# Patient Record
Sex: Male | Born: 1940 | Race: White | Hispanic: No | Marital: Single | Smoking: Former smoker
Health system: Southern US, Community
[De-identification: ages and names within clinical notes are randomized; demographics above are authoritative.]

## PROBLEM LIST (undated history)

## (undated) DIAGNOSIS — Z8719 Personal history of other diseases of the digestive system: Secondary | ICD-10-CM

## (undated) DIAGNOSIS — I219 Acute myocardial infarction, unspecified: Secondary | ICD-10-CM

## (undated) DIAGNOSIS — K219 Gastro-esophageal reflux disease without esophagitis: Secondary | ICD-10-CM

## (undated) DIAGNOSIS — J069 Acute upper respiratory infection, unspecified: Secondary | ICD-10-CM

## (undated) DIAGNOSIS — Z5189 Encounter for other specified aftercare: Secondary | ICD-10-CM

## (undated) DIAGNOSIS — G4730 Sleep apnea, unspecified: Secondary | ICD-10-CM

## (undated) DIAGNOSIS — I2510 Atherosclerotic heart disease of native coronary artery without angina pectoris: Secondary | ICD-10-CM

## (undated) DIAGNOSIS — J449 Chronic obstructive pulmonary disease, unspecified: Secondary | ICD-10-CM

## (undated) DIAGNOSIS — I10 Essential (primary) hypertension: Secondary | ICD-10-CM

## (undated) DIAGNOSIS — M109 Gout, unspecified: Secondary | ICD-10-CM

## (undated) DIAGNOSIS — R0602 Shortness of breath: Secondary | ICD-10-CM

## (undated) HISTORY — PX: CORONARY ARTERY BYPASS GRAFT: SHX141

## (undated) HISTORY — PX: CERVICAL FUSION: SHX112

## (undated) HISTORY — PX: COLON SURGERY: SHX602

## (undated) HISTORY — PX: REVISION OF SCAR ON FACE/HEAD: SHX2349

## (undated) HISTORY — PX: KNEE SURGERY: SHX244

## (undated) HISTORY — PX: FINGER SURGERY: SHX640

## (undated) HISTORY — PX: CARDIAC CATHETERIZATION: SHX172

## (undated) HISTORY — PX: HERNIA REPAIR: SHX51

## (undated) HISTORY — PX: EYE SURGERY: SHX253

---

## 1998-03-21 ENCOUNTER — Emergency Department (HOSPITAL_COMMUNITY): Admission: EM | Admit: 1998-03-21 | Discharge: 1998-03-21 | Payer: Self-pay | Admitting: Emergency Medicine

## 1998-04-04 ENCOUNTER — Observation Stay (HOSPITAL_COMMUNITY): Admission: RE | Admit: 1998-04-04 | Discharge: 1998-04-05 | Payer: Self-pay | Admitting: Urology

## 1998-05-11 ENCOUNTER — Inpatient Hospital Stay (HOSPITAL_COMMUNITY): Admission: EM | Admit: 1998-05-11 | Discharge: 1998-05-13 | Payer: Self-pay | Admitting: Emergency Medicine

## 1998-05-12 ENCOUNTER — Encounter: Payer: Self-pay | Admitting: Urology

## 1998-07-12 ENCOUNTER — Emergency Department (HOSPITAL_COMMUNITY): Admission: EM | Admit: 1998-07-12 | Discharge: 1998-07-12 | Payer: Self-pay | Admitting: Emergency Medicine

## 1998-07-12 ENCOUNTER — Encounter: Payer: Self-pay | Admitting: Emergency Medicine

## 1998-07-13 ENCOUNTER — Emergency Department (HOSPITAL_COMMUNITY): Admission: EM | Admit: 1998-07-13 | Discharge: 1998-07-14 | Payer: Self-pay | Admitting: Emergency Medicine

## 1998-07-13 ENCOUNTER — Encounter: Payer: Self-pay | Admitting: Urology

## 1998-08-29 ENCOUNTER — Ambulatory Visit (HOSPITAL_BASED_OUTPATIENT_CLINIC_OR_DEPARTMENT_OTHER): Admission: RE | Admit: 1998-08-29 | Discharge: 1998-08-29 | Payer: Self-pay | Admitting: General Surgery

## 1999-06-29 ENCOUNTER — Emergency Department (HOSPITAL_COMMUNITY): Admission: EM | Admit: 1999-06-29 | Discharge: 1999-06-29 | Payer: Self-pay | Admitting: Emergency Medicine

## 1999-08-16 ENCOUNTER — Encounter: Payer: Self-pay | Admitting: Emergency Medicine

## 1999-08-16 ENCOUNTER — Emergency Department (HOSPITAL_COMMUNITY): Admission: EM | Admit: 1999-08-16 | Discharge: 1999-08-16 | Payer: Self-pay | Admitting: Emergency Medicine

## 1999-08-18 ENCOUNTER — Inpatient Hospital Stay (HOSPITAL_COMMUNITY): Admission: EM | Admit: 1999-08-18 | Discharge: 1999-08-21 | Payer: Self-pay | Admitting: Internal Medicine

## 1999-09-04 ENCOUNTER — Ambulatory Visit (HOSPITAL_COMMUNITY): Admission: RE | Admit: 1999-09-04 | Discharge: 1999-09-04 | Payer: Self-pay | Admitting: Urology

## 1999-09-04 ENCOUNTER — Encounter: Payer: Self-pay | Admitting: Urology

## 1999-12-03 ENCOUNTER — Emergency Department (HOSPITAL_COMMUNITY): Admission: EM | Admit: 1999-12-03 | Discharge: 1999-12-03 | Payer: Self-pay | Admitting: *Deleted

## 1999-12-03 ENCOUNTER — Encounter: Payer: Self-pay | Admitting: Emergency Medicine

## 2000-05-11 ENCOUNTER — Ambulatory Visit (HOSPITAL_COMMUNITY): Admission: RE | Admit: 2000-05-11 | Discharge: 2000-05-11 | Payer: Self-pay | Admitting: Neurological Surgery

## 2000-05-11 ENCOUNTER — Encounter: Payer: Self-pay | Admitting: Neurological Surgery

## 2000-09-24 ENCOUNTER — Encounter: Payer: Self-pay | Admitting: Neurological Surgery

## 2000-09-26 ENCOUNTER — Inpatient Hospital Stay (HOSPITAL_COMMUNITY): Admission: RE | Admit: 2000-09-26 | Discharge: 2000-09-28 | Payer: Self-pay | Admitting: Neurological Surgery

## 2000-09-26 ENCOUNTER — Encounter: Payer: Self-pay | Admitting: Neurological Surgery

## 2000-10-14 ENCOUNTER — Encounter: Admission: RE | Admit: 2000-10-14 | Discharge: 2000-10-14 | Payer: Self-pay | Admitting: Neurological Surgery

## 2000-10-14 ENCOUNTER — Encounter: Payer: Self-pay | Admitting: Neurological Surgery

## 2001-01-29 ENCOUNTER — Emergency Department (HOSPITAL_COMMUNITY): Admission: EM | Admit: 2001-01-29 | Discharge: 2001-01-30 | Payer: Self-pay | Admitting: Emergency Medicine

## 2001-02-28 ENCOUNTER — Encounter: Payer: Self-pay | Admitting: Neurological Surgery

## 2001-02-28 ENCOUNTER — Encounter: Admission: RE | Admit: 2001-02-28 | Discharge: 2001-02-28 | Payer: Self-pay | Admitting: Neurological Surgery

## 2001-04-07 ENCOUNTER — Encounter: Payer: Self-pay | Admitting: Gastroenterology

## 2001-04-07 ENCOUNTER — Encounter: Admission: RE | Admit: 2001-04-07 | Discharge: 2001-04-07 | Payer: Self-pay | Admitting: Gastroenterology

## 2001-05-02 ENCOUNTER — Encounter: Payer: Self-pay | Admitting: Gastroenterology

## 2001-05-02 ENCOUNTER — Ambulatory Visit (HOSPITAL_COMMUNITY): Admission: RE | Admit: 2001-05-02 | Discharge: 2001-05-02 | Payer: Self-pay | Admitting: Gastroenterology

## 2001-07-11 ENCOUNTER — Ambulatory Visit (HOSPITAL_COMMUNITY): Admission: RE | Admit: 2001-07-11 | Discharge: 2001-07-11 | Payer: Self-pay | Admitting: Gastroenterology

## 2001-07-22 ENCOUNTER — Emergency Department (HOSPITAL_COMMUNITY): Admission: EM | Admit: 2001-07-22 | Discharge: 2001-07-22 | Payer: Self-pay

## 2001-07-25 ENCOUNTER — Encounter: Payer: Self-pay | Admitting: Urology

## 2001-07-25 ENCOUNTER — Ambulatory Visit (HOSPITAL_COMMUNITY): Admission: RE | Admit: 2001-07-25 | Discharge: 2001-07-25 | Payer: Self-pay | Admitting: Urology

## 2001-12-09 ENCOUNTER — Encounter: Admission: RE | Admit: 2001-12-09 | Discharge: 2001-12-09 | Payer: Self-pay | Admitting: Neurosurgery

## 2001-12-09 ENCOUNTER — Encounter: Payer: Self-pay | Admitting: Neurosurgery

## 2001-12-17 ENCOUNTER — Encounter: Payer: Self-pay | Admitting: Neurosurgery

## 2001-12-22 ENCOUNTER — Ambulatory Visit (HOSPITAL_COMMUNITY): Admission: RE | Admit: 2001-12-22 | Discharge: 2001-12-22 | Payer: Self-pay | Admitting: Neurosurgery

## 2001-12-22 ENCOUNTER — Encounter: Payer: Self-pay | Admitting: Neurosurgery

## 2002-02-19 ENCOUNTER — Encounter: Admission: RE | Admit: 2002-02-19 | Discharge: 2002-02-19 | Payer: Self-pay | Admitting: Neurosurgery

## 2002-02-19 ENCOUNTER — Encounter: Payer: Self-pay | Admitting: Neurosurgery

## 2002-04-27 ENCOUNTER — Ambulatory Visit (HOSPITAL_COMMUNITY): Admission: RE | Admit: 2002-04-27 | Discharge: 2002-04-27 | Payer: Self-pay | Admitting: Internal Medicine

## 2003-06-22 ENCOUNTER — Encounter: Admission: RE | Admit: 2003-06-22 | Discharge: 2003-06-22 | Payer: Self-pay | Admitting: Neurosurgery

## 2003-06-28 ENCOUNTER — Encounter: Admission: RE | Admit: 2003-06-28 | Discharge: 2003-09-26 | Payer: Self-pay | Admitting: Neurosurgery

## 2004-12-06 ENCOUNTER — Ambulatory Visit: Payer: Self-pay | Admitting: Oncology

## 2011-08-20 ENCOUNTER — Encounter (INDEPENDENT_AMBULATORY_CARE_PROVIDER_SITE_OTHER): Payer: Medicare Other | Admitting: Ophthalmology

## 2011-08-20 DIAGNOSIS — H35039 Hypertensive retinopathy, unspecified eye: Secondary | ICD-10-CM

## 2011-08-20 DIAGNOSIS — I10 Essential (primary) hypertension: Secondary | ICD-10-CM

## 2011-08-20 DIAGNOSIS — H33009 Unspecified retinal detachment with retinal break, unspecified eye: Secondary | ICD-10-CM

## 2011-08-20 DIAGNOSIS — H43819 Vitreous degeneration, unspecified eye: Secondary | ICD-10-CM

## 2011-08-20 DIAGNOSIS — H33002 Unspecified retinal detachment with retinal break, left eye: Secondary | ICD-10-CM

## 2011-08-20 NOTE — H&P (Signed)
Bryce Buck is an 71 y.o. male.   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    Negative for HIV and Hepatitis  Coronary Artery bypass graft 2007.  Finger amputations both hands  No family history on file. Social History:  does not have a smoking history on file. He does not have any smokeless tobacco history on file. His alcohol and drug histories not on file.  Allergies: Allergies not on file  No current facility-administered medications on file as of .   No current outpatient prescriptions on file as of .    Review of systems otherwise negative  There were no vitals taken for this visit.  Physical exam: Mental status: oriented x3. Eyes: See eye exam associated with this date and this surgery.  Scanned in by scanning center Ears, Nose, Throat: within normal limits Neck: Within Normal limits General: within normal limits Chest: Within normal limits Breast: deferred Heart: Within normal limits Abdomen: Within normal limits GU: deferred Extremities: within normal limits Skin: within normal limits  Assessment/Plan Rhegmatogenous retinal detachment left eye Plan: To [facility] for scleral buckle and possible vitrectomy left eye  Sherrie George 08/20/2011, 5:55 PM

## 2011-08-23 ENCOUNTER — Other Ambulatory Visit: Payer: Self-pay

## 2011-08-23 ENCOUNTER — Encounter (HOSPITAL_COMMUNITY): Payer: Self-pay | Admitting: Pharmacy Technician

## 2011-08-23 ENCOUNTER — Encounter (HOSPITAL_COMMUNITY)
Admission: RE | Admit: 2011-08-23 | Discharge: 2011-08-23 | Disposition: A | Discharge status: Home / Self Care | Payer: Medicare Other | Source: Ambulatory Visit | Attending: Ophthalmology | Admitting: Ophthalmology

## 2011-08-23 ENCOUNTER — Ambulatory Visit (HOSPITAL_COMMUNITY)
Admission: RE | Admit: 2011-08-23 | Discharge: 2011-08-23 | Disposition: A | Discharge status: Home / Self Care | Payer: Medicare Other | Source: Ambulatory Visit | Attending: Anesthesiology | Admitting: Anesthesiology

## 2011-08-23 ENCOUNTER — Encounter (HOSPITAL_COMMUNITY): Payer: Self-pay

## 2011-08-23 DIAGNOSIS — Z01818 Encounter for other preprocedural examination: Secondary | ICD-10-CM | POA: Insufficient documentation

## 2011-08-23 DIAGNOSIS — Z01810 Encounter for preprocedural cardiovascular examination: Secondary | ICD-10-CM | POA: Insufficient documentation

## 2011-08-23 DIAGNOSIS — Z01812 Encounter for preprocedural laboratory examination: Secondary | ICD-10-CM | POA: Insufficient documentation

## 2011-08-23 HISTORY — DX: Acute upper respiratory infection, unspecified: J06.9

## 2011-08-23 HISTORY — DX: Acute myocardial infarction, unspecified: I21.9

## 2011-08-23 HISTORY — DX: Encounter for other specified aftercare: Z51.89

## 2011-08-23 HISTORY — DX: Essential (primary) hypertension: I10

## 2011-08-23 HISTORY — DX: Sleep apnea, unspecified: G47.30

## 2011-08-23 HISTORY — DX: Atherosclerotic heart disease of native coronary artery without angina pectoris: I25.10

## 2011-08-23 HISTORY — DX: Shortness of breath: R06.02

## 2011-08-23 LAB — BASIC METABOLIC PANEL
CO2: 26 mEq/L (ref 19–32)
Glucose, Bld: 72 mg/dL (ref 70–99)
Potassium: 4 mEq/L (ref 3.5–5.1)
Sodium: 139 mEq/L (ref 135–145)

## 2011-08-23 LAB — CBC
Hemoglobin: 15.5 g/dL (ref 13.0–17.0)
MCH: 30.3 pg (ref 26.0–34.0)
MCV: 88.1 fL (ref 78.0–100.0)
RBC: 5.11 MIL/uL (ref 4.22–5.81)
WBC: 8.5 10*3/uL (ref 4.0–10.5)

## 2011-08-23 NOTE — Progress Notes (Signed)
Faxed request 2x's to [facility] for records
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
## 2011-08-23 NOTE — Consult Note (Signed)
Anesthesia:  Patient is a 71 year old male who presented to Dr. Anastasio Auerbach office on 08/20/11 with loss of vision in his left eye and was found to have a retinal detachment.  He is scheduled for a scleral buckle with possible vitrectomy on 08/28/11.    He has a history of a MI and CABG in 2007 [facility]).  His Cardiologist is Dr Bing Matter [phone]) with [facility] in [place].  He hasn't been seen since shortly after his CABG.  (His story is a little vague, but it sounds like he got upset with his Cardiologist because Dr. Bing Matter would not clear him for a shoulder surgery without being seen, and the surgery was canceled.)  He has not had further chest pain since his MI in 2007 and denies SOB at rest.  He does get mild DOE with exertion, but this is more related to activities outside (not indoors).  He lives alone and spends most of his time watching TV.  He does do his own shopping and can walk around the stores without difficulty.  He quit smoking 21 years ago and denies history of DM or CVA.  He denies edema, palpitations, presyncope.  He is actually going to get re-established with Dr. Bing Matter on 09/05/11, but he could not get him in before his planned surgery date.  His PCP is Dr Cheri Rous who maintains him on ASA, Norvasc, Toprol XL, Zocor, and Prilosec.    His EKG today shows SB at 56 bpm, cannot rule out anterior infarct (age undetermined).  Currently there are no comparison EKGs.  We did request records from [facility].    Additional history includes OSA with use of CPAP, colon resection with colostomy (4-5 years ago) following colonoscopy complicated by bowel perforation, HTN, partial finger amputations, cervical fusion, and prior knee and hernia surgeries.  CXR shows: Status post median sternotomy. No active disease. Mild  hyperinflation. Minimal basilar atelectasis or scarring.   Labs acceptable.  I reviewed above with Anesthesiologist Dr. Katrinka Blazing.  Okay to proceed  with this procedure if no new CV symptoms.

## 2011-08-23 NOTE — Pre-Procedure Instructions (Signed)
[address]  08/23/2011   Your procedure is scheduled on:  08/28/2011  Report to [facility] at 9:00 AM.  Call this number if you have problems the morning of surgery: [phone]   Remember:   Do not eat food:After Midnight.  May have clear liquids: up to 4 Hours before arrival.  Clear liquids include soda, tea, black coffee, apple or grape juice, broth.  Take these medicines the morning of surgery with A SIP OF WATER: prilosec, amlodipine    Do not wear jewelry, make-up or nail polish.  Do not wear lotions, powders, or perfumes. You may wear deodorant.  Do not shave 48 hours prior to surgery.  Do not bring valuables to the hospital.  Contacts, dentures or bridgework may not be worn into surgery.  Leave suitcase in the car. After surgery it may be brought to your room.  For patients admitted to the hospital, checkout time is 11:00 AM the day of discharge.   Patients discharged the day of surgery will not be allowed to drive home.  Name and phone number of your driver: wit SISTER  Special Instructions: CHG Shower Use Special Wash: 1/2 bottle night before surgery and 1/2 bottle morning of surgery.   Please read over the following fact sheets that you were given: Pain Booklet, Coughing and Deep Breathing, MRSA Information and Surgical Site Infection Prevention

## 2011-08-27 MED ORDER — CYCLOPENTOLATE HCL 1 % OP SOLN
1.0000 [drp] | OPHTHALMIC | Status: AC | PRN
  Administered 2011-08-28 (×3): 1 [drp] via OPHTHALMIC
  Filled 2011-08-27: qty 2

## 2011-08-27 MED ORDER — GATIFLOXACIN 0.5 % OP SOLN
1.0000 [drp] | OPHTHALMIC | Status: AC | PRN
  Administered 2011-08-28 (×3): 1 [drp] via OPHTHALMIC
  Filled 2011-08-27: qty 2.5

## 2011-08-27 MED ORDER — PHENYLEPHRINE HCL 2.5 % OP SOLN
1.0000 [drp] | OPHTHALMIC | Status: AC | PRN
  Administered 2011-08-28 (×3): 1 [drp] via OPHTHALMIC
  Filled 2011-08-27: qty 3

## 2011-08-27 MED ORDER — TROPICAMIDE 1 % OP SOLN
1.0000 [drp] | OPHTHALMIC | Status: AC | PRN
  Administered 2011-08-28 (×3): 1 [drp] via OPHTHALMIC
  Filled 2011-08-27: qty 3

## 2011-08-27 MED ORDER — CEFAZOLIN SODIUM-DEXTROSE 2-3 GM-% IV SOLR
2.0000 g | INTRAVENOUS | Status: AC
  Administered 2011-08-28: 2 g via INTRAVENOUS
  Filled 2011-08-27: qty 50

## 2011-08-28 ENCOUNTER — Ambulatory Visit (HOSPITAL_COMMUNITY)
Admission: RE | Admit: 2011-08-28 | Discharge: 2011-08-29 | Disposition: A | Discharge status: Home / Self Care | Payer: Medicare Other | Source: Ambulatory Visit | Attending: Ophthalmology | Admitting: Ophthalmology

## 2011-08-28 ENCOUNTER — Encounter (HOSPITAL_COMMUNITY): Payer: Self-pay | Admitting: Vascular Surgery

## 2011-08-28 ENCOUNTER — Ambulatory Visit (HOSPITAL_COMMUNITY): Payer: Medicare Other | Admitting: Vascular Surgery

## 2011-08-28 ENCOUNTER — Other Ambulatory Visit (HOSPITAL_COMMUNITY): Payer: Self-pay | Admitting: Ophthalmology

## 2011-08-28 ENCOUNTER — Encounter (HOSPITAL_COMMUNITY)
Admission: RE | Disposition: A | Discharge status: Home / Self Care | Payer: Self-pay | Source: Ambulatory Visit | Attending: Ophthalmology

## 2011-08-28 ENCOUNTER — Encounter (HOSPITAL_COMMUNITY): Payer: Self-pay | Admitting: *Deleted

## 2011-08-28 DIAGNOSIS — K08109 Complete loss of teeth, unspecified cause, unspecified class: Secondary | ICD-10-CM | POA: Insufficient documentation

## 2011-08-28 DIAGNOSIS — I252 Old myocardial infarction: Secondary | ICD-10-CM | POA: Insufficient documentation

## 2011-08-28 DIAGNOSIS — Z87891 Personal history of nicotine dependence: Secondary | ICD-10-CM | POA: Insufficient documentation

## 2011-08-28 DIAGNOSIS — Z951 Presence of aortocoronary bypass graft: Secondary | ICD-10-CM | POA: Insufficient documentation

## 2011-08-28 DIAGNOSIS — H33002 Unspecified retinal detachment with retinal break, left eye: Secondary | ICD-10-CM

## 2011-08-28 DIAGNOSIS — I10 Essential (primary) hypertension: Secondary | ICD-10-CM | POA: Insufficient documentation

## 2011-08-28 DIAGNOSIS — H33009 Unspecified retinal detachment with retinal break, unspecified eye: Secondary | ICD-10-CM

## 2011-08-28 DIAGNOSIS — I2510 Atherosclerotic heart disease of native coronary artery without angina pectoris: Secondary | ICD-10-CM | POA: Insufficient documentation

## 2011-08-28 DIAGNOSIS — S68118A Complete traumatic metacarpophalangeal amputation of other finger, initial encounter: Secondary | ICD-10-CM | POA: Insufficient documentation

## 2011-08-28 DIAGNOSIS — R0602 Shortness of breath: Secondary | ICD-10-CM | POA: Insufficient documentation

## 2011-08-28 HISTORY — PX: SCLERAL BUCKLE: SHX5340

## 2011-08-28 SURGERY — SCLERAL BUCKLE
Anesthesia: General | Site: Eye | Laterality: Left | Wound class: Clean

## 2011-08-28 MED ORDER — PROPOFOL 10 MG/ML IV EMUL
INTRAVENOUS | Status: DC | PRN
  Administered 2011-08-28: 170 mg via INTRAVENOUS

## 2011-08-28 MED ORDER — NEOSTIGMINE METHYLSULFATE 1 MG/ML IJ SOLN
INTRAMUSCULAR | Status: DC | PRN
  Administered 2011-08-28: 4 mg via INTRAVENOUS

## 2011-08-28 MED ORDER — MORPHINE SULFATE 2 MG/ML IJ SOLN: 1.0000 mg | INTRAMUSCULAR | Status: DC | PRN

## 2011-08-28 MED ORDER — LATANOPROST 0.005 % OP SOLN
1.0000 [drp] | Freq: Every day | OPHTHALMIC | Status: DC
  Filled 2011-08-28: qty 2.5

## 2011-08-28 MED ORDER — GATIFLOXACIN 0.5 % OP SOLN
1.0000 [drp] | Freq: Four times a day (QID) | OPHTHALMIC | Status: DC
  Filled 2011-08-28: qty 2.5

## 2011-08-28 MED ORDER — MIDAZOLAM HCL 5 MG/5ML IJ SOLN
INTRAMUSCULAR | Status: DC | PRN
  Administered 2011-08-28: 2 mg via INTRAVENOUS

## 2011-08-28 MED ORDER — SODIUM CHLORIDE 0.9 % IJ SOLN
INTRAMUSCULAR | Status: DC | PRN
  Administered 2011-08-28: 14:00:00

## 2011-08-28 MED ORDER — TEMAZEPAM 15 MG PO CAPS: 15.0000 mg | ORAL_CAPSULE | Freq: Every evening | ORAL | Status: DC | PRN

## 2011-08-28 MED ORDER — ROCURONIUM BROMIDE 100 MG/10ML IV SOLN
INTRAVENOUS | Status: DC | PRN
  Administered 2011-08-28 (×2): 10 mg via INTRAVENOUS
  Administered 2011-08-28: 20 mg via INTRAVENOUS

## 2011-08-28 MED ORDER — ACETAZOLAMIDE SODIUM 500 MG IJ SOLR
500.0000 mg | Freq: Once | INTRAMUSCULAR | Status: AC
  Administered 2011-08-29: 500 mg via INTRAVENOUS
  Filled 2011-08-28: qty 500

## 2011-08-28 MED ORDER — LACTATED RINGERS IV SOLN: INTRAVENOUS | Status: DC

## 2011-08-28 MED ORDER — GLYCOPYRROLATE 0.2 MG/ML IJ SOLN
INTRAMUSCULAR | Status: DC | PRN
  Administered 2011-08-28: .7 mg via INTRAVENOUS

## 2011-08-28 MED ORDER — ONDANSETRON HCL 4 MG/2ML IJ SOLN
INTRAMUSCULAR | Status: DC | PRN
  Administered 2011-08-28: 4 mg via INTRAVENOUS

## 2011-08-28 MED ORDER — ATROPINE SULFATE 1 % OP SOLN
OPHTHALMIC | Status: DC | PRN
  Administered 2011-08-28: 1 [drp] via OPHTHALMIC

## 2011-08-28 MED ORDER — HEMOSTATIC AGENTS (NO CHARGE) OPTIME
TOPICAL | Status: DC | PRN
  Administered 2011-08-28: 1

## 2011-08-28 MED ORDER — OXYCODONE-ACETAMINOPHEN 5-325 MG PO TABS
ORAL_TABLET | ORAL | Status: AC
  Filled 2011-08-28: qty 2

## 2011-08-28 MED ORDER — BSS IO SOLN
INTRAOCULAR | Status: DC | PRN
  Administered 2011-08-28: 15 mL via INTRAOCULAR

## 2011-08-28 MED ORDER — LACTATED RINGERS IV SOLN: INTRAVENOUS | Status: DC | PRN

## 2011-08-28 MED ORDER — MAGNESIUM HYDROXIDE 400 MG/5ML PO SUSP: 15.0000 mL | Freq: Four times a day (QID) | ORAL | Status: DC | PRN

## 2011-08-28 MED ORDER — BRIMONIDINE TARTRATE 0.2 % OP SOLN
1.0000 [drp] | Freq: Two times a day (BID) | OPHTHALMIC | Status: DC
  Filled 2011-08-28: qty 5

## 2011-08-28 MED ORDER — PROMETHAZINE HCL 25 MG/ML IJ SOLN: 6.2500 mg | INTRAMUSCULAR | Status: DC | PRN

## 2011-08-28 MED ORDER — ACETAMINOPHEN 325 MG PO TABS: 325.0000 mg | ORAL_TABLET | ORAL | Status: DC | PRN

## 2011-08-28 MED ORDER — SODIUM CHLORIDE 0.45 % IV SOLN
INTRAVENOUS | Status: DC
  Administered 2011-08-28: 17:00:00 via INTRAVENOUS

## 2011-08-28 MED ORDER — SUCCINYLCHOLINE CHLORIDE 20 MG/ML IJ SOLN
INTRAMUSCULAR | Status: DC | PRN
  Administered 2011-08-28: 100 mg via INTRAVENOUS

## 2011-08-28 MED ORDER — DEXAMETHASONE SODIUM PHOSPHATE 10 MG/ML IJ SOLN
INTRAMUSCULAR | Status: DC | PRN
  Administered 2011-08-28: 10 mg

## 2011-08-28 MED ORDER — MEPERIDINE HCL 25 MG/ML IJ SOLN: 6.2500 mg | INTRAMUSCULAR | Status: DC | PRN

## 2011-08-28 MED ORDER — ONDANSETRON HCL 4 MG/2ML IJ SOLN
4.0000 mg | Freq: Four times a day (QID) | INTRAMUSCULAR | Status: DC | PRN
  Administered 2011-08-28: 4 mg via INTRAVENOUS
  Filled 2011-08-28: qty 2

## 2011-08-28 MED ORDER — BACITRACIN-POLYMYXIN B 500-10000 UNIT/GM OP OINT
TOPICAL_OINTMENT | OPHTHALMIC | Status: DC | PRN
  Administered 2011-08-28: 1 via OPHTHALMIC

## 2011-08-28 MED ORDER — BACITRACIN-POLYMYXIN B 500-10000 UNIT/GM OP OINT: TOPICAL_OINTMENT | Freq: Two times a day (BID) | OPHTHALMIC | Status: AC

## 2011-08-28 MED ORDER — BUPIVACAINE HCL 0.75 % IJ SOLN
INTRAMUSCULAR | Status: DC | PRN
  Administered 2011-08-28: 10 mL

## 2011-08-28 MED ORDER — PREDNISOLONE ACETATE 1 % OP SUSP
1.0000 [drp] | Freq: Four times a day (QID) | OPHTHALMIC | Status: DC
  Filled 2011-08-28: qty 1

## 2011-08-28 MED ORDER — SODIUM CHLORIDE 0.9 % IV SOLN
INTRAVENOUS | Status: DC
  Administered 2011-08-28 (×2): via INTRAVENOUS

## 2011-08-28 MED ORDER — SODIUM CHLORIDE 0.9 % IV SOLN
INTRAVENOUS | Status: DC | PRN
  Administered 2011-08-28 (×2): via INTRAVENOUS

## 2011-08-28 MED ORDER — TETRACAINE HCL 0.5 % OP SOLN
2.0000 [drp] | Freq: Once | OPHTHALMIC | Status: DC
  Filled 2011-08-28: qty 2

## 2011-08-28 MED ORDER — OXYCODONE-ACETAMINOPHEN 5-325 MG PO TABS
1.0000 | ORAL_TABLET | ORAL | Status: DC | PRN
  Administered 2011-08-28 – 2011-08-29 (×3): 2 via ORAL
  Filled 2011-08-28 (×2): qty 2

## 2011-08-28 MED ORDER — HYDROMORPHONE HCL PF 1 MG/ML IJ SOLN
0.2500 mg | INTRAMUSCULAR | Status: DC | PRN
  Administered 2011-08-28 (×2): 0.5 mg via INTRAVENOUS

## 2011-08-28 MED ORDER — EPHEDRINE SULFATE 50 MG/ML IJ SOLN
INTRAMUSCULAR | Status: DC | PRN
  Administered 2011-08-28 (×2): 10 mg via INTRAVENOUS

## 2011-08-28 MED ORDER — FENTANYL CITRATE 0.05 MG/ML IJ SOLN
INTRAMUSCULAR | Status: DC | PRN
  Administered 2011-08-28: 100 ug via INTRAVENOUS
  Administered 2011-08-28 (×2): 50 ug via INTRAVENOUS

## 2011-08-28 SURGICAL SUPPLY — 72 items
APL SRG 3 HI ABS STRL LF PLS (MISCELLANEOUS) ×5
APPLICATOR DR MATTHEWS STRL (MISCELLANEOUS) ×13 IMPLANT
BALL CTTN LRG ABS STRL LF (GAUZE/BANDAGES/DRESSINGS) ×3
BAND SCLERAL BUCKLING TYPE 240 (Ophthalmic Related) ×1 IMPLANT
BLADE EYE CATARACT 19 1.4 BEAV (BLADE) IMPLANT
BLADE MVR KNIFE 19G (BLADE) IMPLANT
BLADE SURG 15 STRL LF DISP TIS (BLADE) IMPLANT
BLADE SURG 15 STRL SS (BLADE)
CANNULA ANT CHAM MAIN (OPHTHALMIC RELATED) IMPLANT
CANNULA DUAL BORE 23G (CANNULA) IMPLANT
CORDS BIPOLAR (ELECTRODE) IMPLANT
COTTONBALL LRG STERILE PKG (GAUZE/BANDAGES/DRESSINGS) ×6 IMPLANT
COVER SURGICAL LIGHT HANDLE (MISCELLANEOUS) ×2 IMPLANT
DRAPE OPHTHALMIC 77X100 STRL (CUSTOM PROCEDURE TRAY) ×2 IMPLANT
ERASER HMR WETFIELD 23G BP (MISCELLANEOUS) IMPLANT
FILTER BLUE MILLIPORE (MISCELLANEOUS) IMPLANT
FILTER STRAW FLUID ASPIR (MISCELLANEOUS) IMPLANT
GAS OPHTHALMIC (MISCELLANEOUS) IMPLANT
GLOVE ECLIPSE 6.5 STRL STRAW (GLOVE) ×1 IMPLANT
GLOVE SS BIOGEL STRL SZ 6.5 (GLOVE) ×1 IMPLANT
GLOVE SS BIOGEL STRL SZ 7 (GLOVE) ×1 IMPLANT
GLOVE SUPERSENSE BIOGEL SZ 6.5 (GLOVE) ×1
GLOVE SUPERSENSE BIOGEL SZ 7 (GLOVE) ×1
GLOVE SURG 8.5 LATEX PF (GLOVE) ×3 IMPLANT
GLOVE SURG SS PI 6.5 STRL IVOR (GLOVE) ×1 IMPLANT
GOWN STRL NON-REIN LRG LVL3 (GOWN DISPOSABLE) ×5 IMPLANT
ILLUMINATOR CHOW PICK 25GA (MISCELLANEOUS) IMPLANT
KIT PERFLUORON PROCEDURE 5ML (MISCELLANEOUS) IMPLANT
KIT ROOM TURNOVER OR (KITS) ×2 IMPLANT
KNIFE CRESCENT 1.75 EDGEAHEAD (BLADE) IMPLANT
KNIFE GRIESHABER SHARP 2.5MM (MISCELLANEOUS) ×6 IMPLANT
MARKER SKIN DUAL TIP RULER LAB (MISCELLANEOUS) ×2 IMPLANT
NDL 18GX1X1/2 (RX/OR ONLY) (NEEDLE) ×1 IMPLANT
NDL 25GX 5/8IN NON SAFETY (NEEDLE) IMPLANT
NDL HYPO 30X.5 LL (NEEDLE) ×2 IMPLANT
NEEDLE 18GX1X1/2 (RX/OR ONLY) (NEEDLE) ×2 IMPLANT
NEEDLE 25GX 5/8IN NON SAFETY (NEEDLE) IMPLANT
NEEDLE 27GAX1X1/2 (NEEDLE) IMPLANT
NEEDLE HYPO 30X.5 LL (NEEDLE) ×4 IMPLANT
NS IRRIG 1000ML POUR BTL (IV SOLUTION) ×2 IMPLANT
PACK VITRECTOMY CUSTOM (CUSTOM PROCEDURE TRAY) ×2 IMPLANT
PAD ARMBOARD 7.5X6 YLW CONV (MISCELLANEOUS) ×4 IMPLANT
PAD EYE OVAL STERILE LF (GAUZE/BANDAGES/DRESSINGS) IMPLANT
PAK VITRECTOMY PIK 25 GA (OPHTHALMIC RELATED) IMPLANT
PROBE DIRECTIONAL LASER (MISCELLANEOUS) IMPLANT
REPL STRA BRUSH NDL (NEEDLE) IMPLANT
REPL STRA BRUSH NEEDLE (NEEDLE) IMPLANT
RESERVOIR BACK FLUSH (MISCELLANEOUS) IMPLANT
ROLLS DENTAL (MISCELLANEOUS) ×4 IMPLANT
SET FLUID INJECTOR (SET/KITS/TRAYS/PACK) IMPLANT
SET VGFI TUBING 8065808002 (SET/KITS/TRAYS/PACK) IMPLANT
SPONGE GROOVED SILICONE 4X12X8 (Ophthalmic Related) ×1 IMPLANT
SPONGE SURGIFOAM ABS GEL 12-7 (HEMOSTASIS) ×1 IMPLANT
STOPCOCK 4 WAY LG BORE MALE ST (IV SETS) IMPLANT
SUT CHROMIC 7 0 TG140 8 (SUTURE) ×2 IMPLANT
SUT ETHILON 9 0 TG140 8 (SUTURE) IMPLANT
SUT MERSILENE 4 0 RV 2 (SUTURE) ×4 IMPLANT
SUT SILK 2 0 (SUTURE) ×2
SUT SILK 2-0 18XBRD TIE 12 (SUTURE) ×1 IMPLANT
SUT SILK 4 0 RB 1 (SUTURE) ×2 IMPLANT
SUT VICRYL 7 0 TG140 8 (SUTURE) IMPLANT
SYR 20CC LL (SYRINGE) ×2 IMPLANT
SYR 5ML LL (SYRINGE) IMPLANT
SYR BULB 3OZ (MISCELLANEOUS) ×2 IMPLANT
SYR TB 1ML LUER SLIP (SYRINGE) IMPLANT
SYRINGE 10CC LL (SYRINGE) IMPLANT
TIRE 11 SCLERAL TYPE 279 (Ophthalmic Related) ×1 IMPLANT
TOWEL OR 17X24 6PK STRL BLUE (TOWEL DISPOSABLE) ×6 IMPLANT
TUBING ART PRESS 12 MALE/MALE (MISCELLANEOUS) IMPLANT
VITREORETINAL VISCODISSEC (MISCELLANEOUS) IMPLANT
WATER STERILE IRR 1000ML POUR (IV SOLUTION) ×2 IMPLANT
WIPE INSTRUMENT VISIWIPE 73X73 (MISCELLANEOUS) ×2 IMPLANT

## 2011-08-28 NOTE — Progress Notes (Signed)
Pt in and out cathed to relieve bladder.  Used sterile technique. Pt tolerated well. 900 cc amber urine out.

## 2011-08-28 NOTE — Preoperative (Signed)
Beta Blockers   Reason not to administer Beta Blockers:Not Applicable. Metoprolol @2000  08/27/11

## 2011-08-28 NOTE — Anesthesia Procedure Notes (Signed)
Procedure Name: Intubation Date/Time: 08/28/2011 2:03 PM Performed by: Margaree Mackintosh Pre-anesthesia Checklist: Patient identified, Timeout performed, Emergency Drugs available, Suction available and Patient being monitored Patient Re-evaluated:Patient Re-evaluated prior to inductionOxygen Delivery Method: Circle system utilized Preoxygenation: Pre-oxygenation with 100% oxygen Intubation Type: IV induction and Rapid sequence Laryngoscope Size: Mac and 3 Grade View: Grade II Tube type: Oral Tube size: 8.0 mm Number of attempts: 1 Airway Equipment and Method: Stylet Placement Confirmation: ETT inserted through vocal cords under direct vision,  positive ETCO2 and breath sounds checked- equal and bilateral Secured at: 21 cm Tube secured with: Tape Dental Injury: Teeth and Oropharynx as per pre-operative assessment

## 2011-08-28 NOTE — Anesthesia Postprocedure Evaluation (Signed)
  Anesthesia Post-op Note  Patient: Bryce Buck  Procedure(s) Performed: Procedure(s) (LRB): SCLERAL BUCKLE (Left) DIODE LASER APPLICATION (Left)  Patient Location: PACU  Anesthesia Type: General  Level of Consciousness: awake, alert  and oriented  Airway and Oxygen Therapy: Patient Spontanous Breathing  Post-op Pain: mild  Post-op Assessment: Post-op Vital signs reviewed, Patient's Cardiovascular Status Stable, Respiratory Function Stable, Patent Airway, No signs of Nausea or vomiting and Pain level controlled  Post-op Vital Signs: Reviewed and stable  Complications: No apparent anesthesia complications

## 2011-08-28 NOTE — Transfer of Care (Signed)
Immediate Anesthesia Transfer of Care Note  Patient: Bryce Buck  Procedure(s) Performed: Procedure(s) (LRB): SCLERAL BUCKLE (Left) DIODE LASER APPLICATION (Left)  Patient Location: PACU  Anesthesia Type: General  Level of Consciousness: awake and pateint uncooperative  Airway & Oxygen Therapy: Patient Spontanous Breathing and Patient connected to face mask oxygen  Post-op Assessment: Report given to PACU RN, Post -op Vital signs reviewed and stable and Patient moving all extremities  Post vital signs: Reviewed and stable  Complications: No apparent anesthesia complications

## 2011-08-28 NOTE — H&P (Signed)
I examined the patient today and there is no change in the medical status 

## 2011-08-28 NOTE — Anesthesia Preprocedure Evaluation (Addendum)
Anesthesia Evaluation  Patient identified by MRN, date of birth, ID band Patient awake    Reviewed: Allergy & Precautions, H&P , NPO status , Patient's Chart, lab work & pertinent test results, reviewed documented beta blocker date and time   History of Anesthesia Complications Negative for: history of anesthetic complications  Airway Mallampati: II  Neck ROM: Full    Dental  (+) Edentulous Upper, Edentulous Lower and Dental Advisory Given   Pulmonary shortness of breath and with exertion, sleep apnea and Continuous Positive Airway Pressure Ventilation , Recent URI , former smoker clear to auscultation        Cardiovascular hypertension, + CAD and + Past MI Normal    Neuro/Psych Negative Neurological ROS     GI/Hepatic negative GI ROS, Neg liver ROS,   Endo/Other  Negative Endocrine ROS  Renal/GU negative Renal ROS     Musculoskeletal   Abdominal   Peds  Hematology   Anesthesia Other Findings   Reproductive/Obstetrics                          Anesthesia Physical Anesthesia Plan  ASA: III  Anesthesia Plan: General   Post-op Pain Management:    Induction: Intravenous  Airway Management Planned: Oral ETT  Additional Equipment:   Intra-op Plan:   Post-operative Plan: Extubation in OR  Informed Consent: I have reviewed the patients History and Physical, chart, labs and discussed the procedure including the risks, benefits and alternatives for the proposed anesthesia with the patient or authorized representative who has indicated his/her understanding and acceptance.   Dental advisory given  Plan Discussed with: CRNA and Surgeon  Anesthesia Plan Comments:         Anesthesia Quick Evaluation

## 2011-08-28 NOTE — Brief Op Note (Signed)
Brief Operative note   Preoperative diagnosis:  Pre-Op Diagnosis Codes:    * Retinal detachment with retinal defect, unspecified [361.00] Postoperative diagnosis  Post-Op Diagnosis Codes:    * Retinal detachment with retinal defect, unspecified [361.00]  Procedures: Scleral buckle with laser  Surgeon:  Sherrie George, MD...  Assistant:  Rosalie Doctor SA    Anesthesia: General  Specimen: none  Estimated blood loss:  1cc  Complications: none  Patient sent to PACU in good condition  Composed by Sherrie George MD  Dictation number: [id number]

## 2011-08-29 MED ORDER — PREDNISOLONE ACETATE 1 % OP SUSP: 1.0000 [drp] | Freq: Four times a day (QID) | OPHTHALMIC | Status: AC

## 2011-08-29 MED ORDER — BACITRACIN-POLYMYXIN B 500-10000 UNIT/GM OP OINT
TOPICAL_OINTMENT | Freq: Three times a day (TID) | OPHTHALMIC | Status: DC
  Filled 2011-08-29: qty 3.5

## 2011-08-29 MED ORDER — BACITRACIN-POLYMYXIN B 500-10000 UNIT/GM OP OINT: TOPICAL_OINTMENT | Freq: Three times a day (TID) | OPHTHALMIC | Status: AC

## 2011-08-29 MED ORDER — PREDNISONE 20 MG PO TABS
20.0000 mg | ORAL_TABLET | Freq: Every day | ORAL | Status: DC
  Administered 2011-08-29: 20 mg via ORAL
  Filled 2011-08-29 (×3): qty 1

## 2011-08-29 MED ORDER — PREDNISONE 10 MG PO TABS: 10.0000 mg | ORAL_TABLET | Freq: Every day | ORAL | Status: DC

## 2011-08-29 MED ORDER — OXYCODONE-ACETAMINOPHEN 5-325 MG PO TABS: 1.0000 | ORAL_TABLET | ORAL | Status: AC | PRN

## 2011-08-29 MED ORDER — PREDNISONE 5 MG PO TABS: 20.0000 mg | ORAL_TABLET | Freq: Every day | ORAL | Status: AC

## 2011-08-29 MED ORDER — GATIFLOXACIN 0.5 % OP SOLN: 1.0000 [drp] | Freq: Four times a day (QID) | OPHTHALMIC | Status: DC

## 2011-08-29 NOTE — Progress Notes (Signed)
08/29/2011, 6:50 AM  Mental Status:  Awake, Alert, Oriented  Anterior segment: Cornea  Clear    Anterior Chamber Clear    Lens:    IOL  Intra Ocular Pressure 20 mmHg with Tonopen  Vitreous: Clear   Retina:  Attached Good laser reaction  Impression: Excellent result Retina attached Poor view  Final Diagnosis: Principal Problem:  *Rhegmatogenous retinal detachment of left eye   Plan: start post operative eye drops.  Discharge to home.  Give post operative instructions  Sherrie George 08/29/2011, 6:50 AM

## 2011-08-29 NOTE — Op Note (Signed)
Bryce Buck, Bryce Buck              ACCOUNT NO.:  [id number]  MEDICAL RECORD NO.:  [id number]  LOCATION:  5126                         FACILITY:  [facility]  PHYSICIAN:  Beulah Gandy. Ashley Royalty, M.D. DATE OF BIRTH:  Apr 09, 1941  DATE OF PROCEDURE:  08/28/2011 DATE OF DISCHARGE:  08/29/2011                              OPERATIVE REPORT   ADMISSION DIAGNOSIS:  Rhegmatogenous retinal detachment, left eye.  PROCEDURE: 1. Scleral buckle, left eye. 2. Retinal photocoagulation, left eye.  SURGEON:  Beulah Gandy. Ashley Royalty, M.D.  ASSISTANT:  Rosalie Doctor, SA.  ANESTHESIA:  General.  DETAILS:  Usual prep and drape, 360-degree limbal peritomy, isolation of 4 rectus muscles on 2-0 silk.  Scleral dissection for 360 degrees __________ . 79 implant with 1-mm trim from the posterior edge.  Diathermy placed in the bed.  Two sutures per quadrant for total of 8 scleral sutures were placed.  The buckle was placed around the globe with the joint at 1130 o'clock.  The 240 band was placed around the globe with a 270 sleeve at 1130 o'clock.  Perforation site chosen at 8 o'clock in the posterior aspect of the bed.  A large amount of clear, colorless subretinal fluid came forth in a slow, controlled manner.  The scleral sutures were drawn securely as the fluid egressed.  A 508 G radial segment was placed beneath the previous break at 3 o'clock.  Indirect ophthalmoscopy showed the retina to be lying nicely on the scleral buckle.  The indirect ophthalmoscope laser was moved into place.  1056 burns were placed around the retinal periphery, 1000 microns in size 0.1 seconds each, and 300-700 milliwatts in power.  The buckle was adjusted and trimmed.  The band was adjusted and trimmed.  The scleral sutures were drawn securely, knotted, and the free ends removed.  Indirect ophthalmoscopy showed the retina to be lying nicely on the scleral buckle.  Two retinal cysts were seen at 8 o'clock indicating chronicity.  Laser was  placed around the cysts.  The conjunctiva was reposited with 7-0 chromic suture. Polymyxin and gentamicin were irrigated into tenon space.  Atropine solution was applied.  Marcaine was injected around the globe for postop pain.  Decadron 10 mg was injected into the lower subconjunctival space. Paracentesis at 3 o'clock x1 obtained.  Closing pressure of 10 with a Baer care tonometer.  Polysporin ophthalmic ointment, patch and shield were placed.  Patient was awakened and taken to Recovery in satisfactory condition.  COMPLICATIONS:  None.  DURATION:  2 hours.     Beulah Gandy. Ashley Royalty, M.D.     JDM/MEDQ  D:  08/28/2011  T:  08/29/2011  Job:  [id number]

## 2011-08-29 NOTE — Progress Notes (Addendum)
DC home with wife. DC instructions given and explained to pt by Dr. Ashley Royalty. No verbal questions at this time.

## 2011-08-29 NOTE — Discharge Summary (Signed)
Discharge summary not needed on [facility] patients per medical records. 

## 2011-09-01 ENCOUNTER — Encounter (HOSPITAL_COMMUNITY): Payer: Self-pay | Admitting: Ophthalmology

## 2011-09-04 ENCOUNTER — Inpatient Hospital Stay (INDEPENDENT_AMBULATORY_CARE_PROVIDER_SITE_OTHER): Payer: Medicare Other | Admitting: Ophthalmology

## 2011-09-04 DIAGNOSIS — H31419 Hemorrhagic choroidal detachment, unspecified eye: Secondary | ICD-10-CM

## 2011-09-04 DIAGNOSIS — H33009 Unspecified retinal detachment with retinal break, unspecified eye: Secondary | ICD-10-CM

## 2011-09-10 ENCOUNTER — Encounter (INDEPENDENT_AMBULATORY_CARE_PROVIDER_SITE_OTHER): Payer: Medicare Other | Admitting: Ophthalmology

## 2011-09-10 DIAGNOSIS — H33009 Unspecified retinal detachment with retinal break, unspecified eye: Secondary | ICD-10-CM

## 2011-09-26 ENCOUNTER — Encounter (INDEPENDENT_AMBULATORY_CARE_PROVIDER_SITE_OTHER): Payer: Medicare Other | Admitting: Ophthalmology

## 2011-09-26 DIAGNOSIS — H33009 Unspecified retinal detachment with retinal break, unspecified eye: Secondary | ICD-10-CM

## 2011-12-05 ENCOUNTER — Encounter (INDEPENDENT_AMBULATORY_CARE_PROVIDER_SITE_OTHER): Payer: Medicare Other | Admitting: Ophthalmology

## 2011-12-06 ENCOUNTER — Encounter (INDEPENDENT_AMBULATORY_CARE_PROVIDER_SITE_OTHER): Payer: Medicare Other | Admitting: Ophthalmology

## 2011-12-06 DIAGNOSIS — H35039 Hypertensive retinopathy, unspecified eye: Secondary | ICD-10-CM

## 2011-12-06 DIAGNOSIS — H35379 Puckering of macula, unspecified eye: Secondary | ICD-10-CM

## 2011-12-06 DIAGNOSIS — I10 Essential (primary) hypertension: Secondary | ICD-10-CM

## 2011-12-06 DIAGNOSIS — H43819 Vitreous degeneration, unspecified eye: Secondary | ICD-10-CM

## 2011-12-06 DIAGNOSIS — H33009 Unspecified retinal detachment with retinal break, unspecified eye: Secondary | ICD-10-CM
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
## 2011-12-24 ENCOUNTER — Encounter (HOSPITAL_COMMUNITY): Payer: Self-pay | Admitting: *Deleted

## 2011-12-24 MED ORDER — CEFAZOLIN SODIUM 1-5 GM-% IV SOLN
1.0000 g | INTRAVENOUS | Status: AC
  Administered 2011-12-25: 1 g via INTRAVENOUS
  Filled 2011-12-24: qty 50

## 2011-12-25 ENCOUNTER — Encounter (HOSPITAL_COMMUNITY): Payer: Self-pay | Admitting: *Deleted

## 2011-12-25 ENCOUNTER — Ambulatory Visit (HOSPITAL_COMMUNITY): Payer: Medicare Other | Admitting: Anesthesiology

## 2011-12-25 ENCOUNTER — Encounter (HOSPITAL_COMMUNITY): Payer: Self-pay | Admitting: Surgery

## 2011-12-25 ENCOUNTER — Encounter (HOSPITAL_COMMUNITY)
Admission: RE | Disposition: A | Discharge status: Home / Self Care | Payer: Self-pay | Source: Ambulatory Visit | Attending: Ophthalmology

## 2011-12-25 ENCOUNTER — Ambulatory Visit (HOSPITAL_COMMUNITY)
Admission: RE | Admit: 2011-12-25 | Discharge: 2011-12-26 | Disposition: A | Discharge status: Home / Self Care | Payer: Medicare Other | Source: Ambulatory Visit | Attending: Ophthalmology | Admitting: Ophthalmology

## 2011-12-25 ENCOUNTER — Encounter (HOSPITAL_COMMUNITY): Payer: Self-pay | Admitting: Anesthesiology

## 2011-12-25 DIAGNOSIS — H35379 Puckering of macula, unspecified eye: Secondary | ICD-10-CM

## 2011-12-25 DIAGNOSIS — J449 Chronic obstructive pulmonary disease, unspecified: Secondary | ICD-10-CM | POA: Insufficient documentation

## 2011-12-25 DIAGNOSIS — G4730 Sleep apnea, unspecified: Secondary | ICD-10-CM | POA: Insufficient documentation

## 2011-12-25 DIAGNOSIS — K219 Gastro-esophageal reflux disease without esophagitis: Secondary | ICD-10-CM | POA: Insufficient documentation

## 2011-12-25 DIAGNOSIS — I2510 Atherosclerotic heart disease of native coronary artery without angina pectoris: Secondary | ICD-10-CM | POA: Insufficient documentation

## 2011-12-25 DIAGNOSIS — I252 Old myocardial infarction: Secondary | ICD-10-CM | POA: Insufficient documentation

## 2011-12-25 DIAGNOSIS — I10 Essential (primary) hypertension: Secondary | ICD-10-CM | POA: Insufficient documentation

## 2011-12-25 DIAGNOSIS — N289 Disorder of kidney and ureter, unspecified: Secondary | ICD-10-CM | POA: Insufficient documentation

## 2011-12-25 DIAGNOSIS — J4489 Other specified chronic obstructive pulmonary disease: Secondary | ICD-10-CM | POA: Insufficient documentation

## 2011-12-25 HISTORY — DX: Chronic obstructive pulmonary disease, unspecified: J44.9

## 2011-12-25 HISTORY — DX: Gastro-esophageal reflux disease without esophagitis: K21.9

## 2011-12-25 HISTORY — PX: GAS/FLUID EXCHANGE: SHX5334

## 2011-12-25 HISTORY — PX: PARS PLANA VITRECTOMY: SHX2166

## 2011-12-25 HISTORY — DX: Personal history of other diseases of the digestive system: Z87.19

## 2011-12-25 LAB — GLUCOSE, CAPILLARY: Glucose-Capillary: 101 mg/dL — ABNORMAL HIGH (ref 70–99)

## 2011-12-25 LAB — CBC
MCH: 30.6 pg (ref 26.0–34.0)
MCV: 88.2 fL (ref 78.0–100.0)
Platelets: 143 10*3/uL — ABNORMAL LOW (ref 150–400)
RDW: 13.2 % (ref 11.5–15.5)

## 2011-12-25 LAB — BASIC METABOLIC PANEL
BUN: 12 mg/dL (ref 6–23)
CO2: 26 mEq/L (ref 19–32)
Calcium: 9.4 mg/dL (ref 8.4–10.5)
Creatinine, Ser: 1.28 mg/dL (ref 0.50–1.35)
Glucose, Bld: 86 mg/dL (ref 70–99)

## 2011-12-25 LAB — SURGICAL PCR SCREEN: MRSA, PCR: NEGATIVE

## 2011-12-25 SURGERY — PARS PLANA VITRECTOMY WITH 25 GAUGE
Anesthesia: General | Site: Eye | Laterality: Left | Wound class: Clean

## 2011-12-25 MED ORDER — OXYCODONE-ACETAMINOPHEN 5-325 MG PO TABS
1.0000 | ORAL_TABLET | ORAL | Status: DC | PRN
  Administered 2011-12-26: 2 via ORAL
  Filled 2011-12-25: qty 2

## 2011-12-25 MED ORDER — NEOSTIGMINE METHYLSULFATE 1 MG/ML IJ SOLN
INTRAMUSCULAR | Status: DC | PRN
  Administered 2011-12-25: 4 mg via INTRAVENOUS

## 2011-12-25 MED ORDER — FENTANYL CITRATE 0.05 MG/ML IJ SOLN
25.0000 ug | INTRAMUSCULAR | Status: DC | PRN
  Administered 2011-12-25 (×4): 25 ug via INTRAVENOUS

## 2011-12-25 MED ORDER — METOPROLOL SUCCINATE ER 100 MG PO TB24
100.0000 mg | ORAL_TABLET | ORAL | Status: AC
  Administered 2011-12-25: 100 mg via ORAL
  Filled 2011-12-25: qty 1

## 2011-12-25 MED ORDER — AMLODIPINE BESYLATE 5 MG PO TABS
5.0000 mg | ORAL_TABLET | ORAL | Status: DC
  Filled 2011-12-25 (×2): qty 1

## 2011-12-25 MED ORDER — ACETAZOLAMIDE SODIUM 500 MG IJ SOLR
500.0000 mg | Freq: Once | INTRAMUSCULAR | Status: AC
  Administered 2011-12-26: 500 mg via INTRAVENOUS
  Filled 2011-12-25: qty 500

## 2011-12-25 MED ORDER — SODIUM CHLORIDE 0.9 % IV SOLN
INTRAVENOUS | Status: DC | PRN
  Administered 2011-12-25 (×2): via INTRAVENOUS

## 2011-12-25 MED ORDER — MAGNESIUM HYDROXIDE 400 MG/5ML PO SUSP: 15.0000 mL | Freq: Four times a day (QID) | ORAL | Status: DC | PRN

## 2011-12-25 MED ORDER — GATIFLOXACIN 0.5 % OP SOLN
1.0000 [drp] | Freq: Four times a day (QID) | OPHTHALMIC | Status: DC
  Filled 2011-12-25: qty 2.5

## 2011-12-25 MED ORDER — ATROPINE SULFATE 1 % OP SOLN
OPHTHALMIC | Status: DC | PRN
  Administered 2011-12-25: 1 [drp] via OPHTHALMIC

## 2011-12-25 MED ORDER — GENTAMICIN SULFATE 40 MG/ML IJ SOLN
INTRAMUSCULAR | Status: AC
  Filled 2011-12-25: qty 2

## 2011-12-25 MED ORDER — TROPICAMIDE 1 % OP SOLN
1.0000 [drp] | OPHTHALMIC | Status: AC | PRN
  Administered 2011-12-25: 1 [drp] via OPHTHALMIC

## 2011-12-25 MED ORDER — BRIMONIDINE TARTRATE 0.2 % OP SOLN
1.0000 [drp] | Freq: Two times a day (BID) | OPHTHALMIC | Status: DC
  Filled 2011-12-25: qty 5

## 2011-12-25 MED ORDER — SODIUM CHLORIDE 0.9 % IJ SOLN
INTRAMUSCULAR | Status: DC | PRN
  Administered 2011-12-25: 15:00:00

## 2011-12-25 MED ORDER — ATROPINE SULFATE 1 % OP SOLN
OPHTHALMIC | Status: AC
  Filled 2011-12-25: qty 2

## 2011-12-25 MED ORDER — HYPROMELLOSE (GONIOSCOPIC) 2.5 % OP SOLN
OPHTHALMIC | Status: AC
  Filled 2011-12-25: qty 15

## 2011-12-25 MED ORDER — GATIFLOXACIN 0.5 % OP SOLN: 1.0000 [drp] | Freq: Four times a day (QID) | OPHTHALMIC | Status: DC

## 2011-12-25 MED ORDER — BUPIVACAINE HCL 0.75 % IJ SOLN
INTRAMUSCULAR | Status: DC | PRN
  Administered 2011-12-25: 20 mL

## 2011-12-25 MED ORDER — GATIFLOXACIN 0.5 % OP SOLN
1.0000 [drp] | OPHTHALMIC | Status: AC | PRN
  Administered 2011-12-25: 1 [drp] via OPHTHALMIC

## 2011-12-25 MED ORDER — ONDANSETRON HCL 4 MG/2ML IJ SOLN
INTRAMUSCULAR | Status: DC | PRN
  Administered 2011-12-25: 4 mg via INTRAVENOUS

## 2011-12-25 MED ORDER — PHENYLEPHRINE HCL 2.5 % OP SOLN
1.0000 [drp] | OPHTHALMIC | Status: AC | PRN
  Administered 2011-12-25: 1 [drp] via OPHTHALMIC

## 2011-12-25 MED ORDER — DEXAMETHASONE SODIUM PHOSPHATE 10 MG/ML IJ SOLN
INTRAMUSCULAR | Status: DC | PRN
  Administered 2011-12-25: 10 mg

## 2011-12-25 MED ORDER — FENTANYL CITRATE 0.05 MG/ML IJ SOLN
INTRAMUSCULAR | Status: AC
  Filled 2011-12-25: qty 2

## 2011-12-25 MED ORDER — SODIUM CHLORIDE 0.45 % IV SOLN
INTRAVENOUS | Status: DC
  Administered 2011-12-25: 1000 mL via INTRAVENOUS

## 2011-12-25 MED ORDER — ONDANSETRON HCL 4 MG/2ML IJ SOLN: 4.0000 mg | Freq: Four times a day (QID) | INTRAMUSCULAR | Status: DC | PRN

## 2011-12-25 MED ORDER — ALBUTEROL SULFATE HFA 108 (90 BASE) MCG/ACT IN AERS
2.0000 | INHALATION_SPRAY | Freq: Four times a day (QID) | RESPIRATORY_TRACT | Status: DC | PRN
Start: 2011-12-25 — End: 2011-12-26
  Filled 2011-12-25: qty 6.7

## 2011-12-25 MED ORDER — CYCLOPENTOLATE HCL 1 % OP SOLN
1.0000 [drp] | OPHTHALMIC | Status: AC | PRN
  Administered 2011-12-25: 1 [drp] via OPHTHALMIC

## 2011-12-25 MED ORDER — TRIAMCINOLONE ACETONIDE 40 MG/ML IJ SUSP
INTRAMUSCULAR | Status: AC
  Filled 2011-12-25: qty 1

## 2011-12-25 MED ORDER — SODIUM CHLORIDE 0.9 % IV SOLN
INTRAVENOUS | Status: DC | PRN
  Administered 2011-12-25: 14:00:00 via INTRAVENOUS

## 2011-12-25 MED ORDER — ASPIRIN EC 81 MG PO TBEC
81.0000 mg | DELAYED_RELEASE_TABLET | ORAL | Status: DC
  Filled 2011-12-25 (×2): qty 1

## 2011-12-25 MED ORDER — ACETAZOLAMIDE SODIUM 500 MG IJ SOLR
INTRAMUSCULAR | Status: AC
  Filled 2011-12-25: qty 500

## 2011-12-25 MED ORDER — LATANOPROST 0.005 % OP SOLN
1.0000 [drp] | Freq: Every day | OPHTHALMIC | Status: DC
  Filled 2011-12-25: qty 2.5

## 2011-12-25 MED ORDER — PANTOPRAZOLE SODIUM 40 MG PO TBEC
40.0000 mg | DELAYED_RELEASE_TABLET | Freq: Every day | ORAL | Status: DC
  Administered 2011-12-25: 40 mg via ORAL
  Filled 2011-12-25: qty 1

## 2011-12-25 MED ORDER — METOPROLOL SUCCINATE ER 50 MG PO TB24
50.0000 mg | ORAL_TABLET | Freq: Two times a day (BID) | ORAL | Status: DC
  Filled 2011-12-25 (×3): qty 1

## 2011-12-25 MED ORDER — BSS IO SOLN
INTRAOCULAR | Status: AC
  Filled 2011-12-25: qty 15

## 2011-12-25 MED ORDER — TEMAZEPAM 15 MG PO CAPS: 15.0000 mg | ORAL_CAPSULE | Freq: Every evening | ORAL | Status: DC | PRN

## 2011-12-25 MED ORDER — TROPICAMIDE 1 % OP SOLN
OPHTHALMIC | Status: AC
  Administered 2011-12-25: 1 [drp] via OPHTHALMIC
  Filled 2011-12-25: qty 3

## 2011-12-25 MED ORDER — HEMOSTATIC AGENTS (NO CHARGE) OPTIME
TOPICAL | Status: DC | PRN
  Administered 2011-12-25: 1 via TOPICAL

## 2011-12-25 MED ORDER — POLYMYXIN B SULFATE 500000 UNITS IJ SOLR
INTRAMUSCULAR | Status: AC
  Filled 2011-12-25: qty 1

## 2011-12-25 MED ORDER — ZOLPIDEM TARTRATE 5 MG PO TABS: 5.0000 mg | ORAL_TABLET | Freq: Every evening | ORAL | Status: DC | PRN

## 2011-12-25 MED ORDER — MORPHINE SULFATE 2 MG/ML IJ SOLN
1.0000 mg | INTRAMUSCULAR | Status: DC | PRN
  Administered 2011-12-25: 4 mg via INTRAVENOUS
  Filled 2011-12-25: qty 2

## 2011-12-25 MED ORDER — ROCURONIUM BROMIDE 100 MG/10ML IV SOLN
INTRAVENOUS | Status: DC | PRN
  Administered 2011-12-25: 50 mg via INTRAVENOUS

## 2011-12-25 MED ORDER — TETRACAINE HCL 0.5 % OP SOLN: 2.0000 [drp] | Freq: Once | OPHTHALMIC | Status: DC

## 2011-12-25 MED ORDER — EPINEPHRINE HCL 1 MG/ML IJ SOLN
INTRAMUSCULAR | Status: AC
  Filled 2011-12-25: qty 1

## 2011-12-25 MED ORDER — SODIUM HYALURONATE 10 MG/ML IO SOLN
INTRAOCULAR | Status: AC
  Filled 2011-12-25: qty 0.85

## 2011-12-25 MED ORDER — DEXAMETHASONE SODIUM PHOSPHATE 10 MG/ML IJ SOLN
INTRAMUSCULAR | Status: AC
  Filled 2011-12-25: qty 1

## 2011-12-25 MED ORDER — FENTANYL CITRATE 0.05 MG/ML IJ SOLN
INTRAMUSCULAR | Status: DC | PRN
  Administered 2011-12-25: 100 ug via INTRAVENOUS
  Administered 2011-12-25: 25 ug via INTRAVENOUS

## 2011-12-25 MED ORDER — PROPOFOL 10 MG/ML IV EMUL
INTRAVENOUS | Status: DC | PRN
  Administered 2011-12-25: 130 mg via INTRAVENOUS

## 2011-12-25 MED ORDER — BSS IO SOLN
INTRAOCULAR | Status: DC | PRN
  Administered 2011-12-25: 15 mL via INTRAOCULAR

## 2011-12-25 MED ORDER — PREDNISOLONE ACETATE 1 % OP SUSP
1.0000 [drp] | Freq: Four times a day (QID) | OPHTHALMIC | Status: DC
  Filled 2011-12-25: qty 1

## 2011-12-25 MED ORDER — BACITRACIN-POLYMYXIN B 500-10000 UNIT/GM OP OINT
TOPICAL_OINTMENT | OPHTHALMIC | Status: DC | PRN
  Administered 2011-12-25: 1 via OPHTHALMIC

## 2011-12-25 MED ORDER — EPINEPHRINE HCL 1 MG/ML IJ SOLN
INTRAMUSCULAR | Status: DC | PRN
  Administered 2011-12-25: 1 mg

## 2011-12-25 MED ORDER — BACITRACIN-POLYMYXIN B 500-10000 UNIT/GM OP OINT
TOPICAL_OINTMENT | OPHTHALMIC | Status: AC
  Filled 2011-12-25: qty 3.5

## 2011-12-25 MED ORDER — SIMVASTATIN 40 MG PO TABS
40.0000 mg | ORAL_TABLET | Freq: Every evening | ORAL | Status: DC
  Administered 2011-12-25: 40 mg via ORAL
  Filled 2011-12-25 (×2): qty 1

## 2011-12-25 MED ORDER — BSS PLUS IO SOLN
INTRAOCULAR | Status: DC | PRN
  Administered 2011-12-25: 1 via OPHTHALMIC

## 2011-12-25 MED ORDER — MIDAZOLAM HCL 5 MG/5ML IJ SOLN
INTRAMUSCULAR | Status: DC | PRN
  Administered 2011-12-25: 2 mg via INTRAVENOUS

## 2011-12-25 MED ORDER — BACITRACIN-POLYMYXIN B 500-10000 UNIT/GM OP OINT
1.0000 "application " | TOPICAL_OINTMENT | Freq: Four times a day (QID) | OPHTHALMIC | Status: DC
  Filled 2011-12-25: qty 3.5

## 2011-12-25 MED ORDER — CYCLOPENTOLATE HCL 1 % OP SOLN
OPHTHALMIC | Status: AC
  Administered 2011-12-25: 1 [drp] via OPHTHALMIC
  Filled 2011-12-25: qty 2

## 2011-12-25 MED ORDER — SODIUM CHLORIDE 0.9 % IV SOLN
INTRAVENOUS | Status: DC
  Administered 2011-12-25: 13:00:00 via INTRAVENOUS

## 2011-12-25 MED ORDER — PROVISC 10 MG/ML IO SOLN
INTRAOCULAR | Status: DC | PRN
  Administered 2011-12-25: .85 mL via INTRAOCULAR

## 2011-12-25 MED ORDER — 0.9 % SODIUM CHLORIDE (POUR BTL) OPTIME
TOPICAL | Status: DC | PRN
  Administered 2011-12-25: 1000 mL

## 2011-12-25 MED ORDER — MUPIROCIN 2 % EX OINT
TOPICAL_OINTMENT | Freq: Two times a day (BID) | CUTANEOUS | Status: DC
  Administered 2011-12-25: 1 via NASAL

## 2011-12-25 MED ORDER — MUPIROCIN 2 % EX OINT
TOPICAL_OINTMENT | CUTANEOUS | Status: AC
  Administered 2011-12-25: 1 via NASAL
  Filled 2011-12-25: qty 22

## 2011-12-25 MED ORDER — BSS PLUS IO SOLN
INTRAOCULAR | Status: AC
  Filled 2011-12-25: qty 500

## 2011-12-25 MED ORDER — PNEUMOCOCCAL VAC POLYVALENT 25 MCG/0.5ML IJ INJ
0.5000 mL | INJECTION | INTRAMUSCULAR | Status: DC
  Filled 2011-12-25: qty 0.5

## 2011-12-25 MED ORDER — PHENYLEPHRINE HCL 10 MG/ML IJ SOLN
INTRAMUSCULAR | Status: DC | PRN
  Administered 2011-12-25 (×2): 40 ug via INTRAVENOUS

## 2011-12-25 MED ORDER — GATIFLOXACIN 0.5 % OP SOLN
OPHTHALMIC | Status: AC
  Administered 2011-12-25: 1 [drp] via OPHTHALMIC
  Filled 2011-12-25: qty 2.5

## 2011-12-25 MED ORDER — GLYCOPYRROLATE 0.2 MG/ML IJ SOLN
INTRAMUSCULAR | Status: DC | PRN
  Administered 2011-12-25: .6 mg via INTRAVENOUS

## 2011-12-25 MED ORDER — PHENYLEPHRINE HCL 2.5 % OP SOLN
OPHTHALMIC | Status: AC
  Administered 2011-12-25: 1 [drp] via OPHTHALMIC
  Filled 2011-12-25: qty 3

## 2011-12-25 MED ORDER — LIDOCAINE HCL 2 % IJ SOLN
INTRAMUSCULAR | Status: AC
  Filled 2011-12-25: qty 1

## 2011-12-25 MED ORDER — ACETAMINOPHEN 325 MG PO TABS
325.0000 mg | ORAL_TABLET | ORAL | Status: DC | PRN
  Administered 2011-12-26: 650 mg via ORAL
  Filled 2011-12-25: qty 2

## 2011-12-25 MED ORDER — BUPIVACAINE HCL 0.75 % IJ SOLN
INTRAMUSCULAR | Status: AC
  Filled 2011-12-25: qty 10

## 2011-12-25 SURGICAL SUPPLY — 57 items
APL SRG 3 HI ABS STRL LF PLS (MISCELLANEOUS)
APPLICATOR DR MATTHEWS STRL (MISCELLANEOUS) IMPLANT
BALL CTTN LRG ABS STRL LF (GAUZE/BANDAGES/DRESSINGS) ×3
BLADE MVR KNIFE 20G (BLADE) ×1 IMPLANT
CANNULA FLEX TIP 25G (CANNULA) IMPLANT
CLOTH BEACON ORANGE TIMEOUT ST (SAFETY) ×2 IMPLANT
CORDS BIPOLAR (ELECTRODE) ×1 IMPLANT
COTTONBALL LRG STERILE PKG (GAUZE/BANDAGES/DRESSINGS) ×6 IMPLANT
DRAPE INCISE 51X51 W/FILM STRL (DRAPES) ×2 IMPLANT
DRAPE OPHTHALMIC 77X100 STRL (CUSTOM PROCEDURE TRAY) ×2 IMPLANT
EAGLE VIT/RET MICRO PIC 168 25 (MISCELLANEOUS) ×1 IMPLANT
FILTER BLUE MILLIPORE (MISCELLANEOUS) IMPLANT
FORCEPS ECKARDT ILM 25G SERR (OPHTHALMIC RELATED) IMPLANT
GLOVE SS BIOGEL STRL SZ 6.5 (GLOVE) ×1 IMPLANT
GLOVE SS BIOGEL STRL SZ 7 (GLOVE) ×1 IMPLANT
GLOVE SUPERSENSE BIOGEL SZ 6.5 (GLOVE) ×1
GLOVE SUPERSENSE BIOGEL SZ 7 (GLOVE) ×1
GLOVE SURG 8.5 LATEX PF (GLOVE) ×3 IMPLANT
GOWN STRL NON-REIN LRG LVL3 (GOWN DISPOSABLE) ×6 IMPLANT
ILLUMINATOR CHOW PICK 25GA (MISCELLANEOUS) ×3 IMPLANT
KIT BASIN OR (CUSTOM PROCEDURE TRAY) ×2 IMPLANT
KIT ROOM TURNOVER OR (KITS) ×2 IMPLANT
KNIFE CRESCENT 2.5 55 ANG (BLADE) ×1 IMPLANT
MASK EYE SHIELD (GAUZE/BANDAGES/DRESSINGS) ×1 IMPLANT
MICROPICK 25G (MISCELLANEOUS)
NDL 18GX1X1/2 (RX/OR ONLY) (NEEDLE) ×1 IMPLANT
NDL 25GX 5/8IN NON SAFETY (NEEDLE) ×1 IMPLANT
NDL FILTER BLUNT 18X1 1/2 (NEEDLE) ×1 IMPLANT
NDL HYPO 30X.5 LL (NEEDLE) ×1 IMPLANT
NEEDLE 18GX1X1/2 (RX/OR ONLY) (NEEDLE) ×2 IMPLANT
NEEDLE 25GX 5/8IN NON SAFETY (NEEDLE) ×2 IMPLANT
NEEDLE FILTER BLUNT 18X 1/2SAF (NEEDLE) ×1
NEEDLE FILTER BLUNT 18X1 1/2 (NEEDLE) ×1 IMPLANT
NEEDLE HYPO 30X.5 LL (NEEDLE) ×2 IMPLANT
NS IRRIG 1000ML POUR BTL (IV SOLUTION) ×2 IMPLANT
PACK VITRECTOMY CUSTOM (CUSTOM PROCEDURE TRAY) ×2 IMPLANT
PAD ARMBOARD 7.5X6 YLW CONV (MISCELLANEOUS) ×4 IMPLANT
PAD EYE OVAL STERILE LF (GAUZE/BANDAGES/DRESSINGS) ×1 IMPLANT
PAK VITRECTOMY PIK 25 GA (OPHTHALMIC RELATED) ×2 IMPLANT
PENCIL BIPOLAR 25GA STR DISP (OPHTHALMIC RELATED) IMPLANT
PICK MICROPICK 25G (MISCELLANEOUS) IMPLANT
PROBE DIRECTIONAL LASER (MISCELLANEOUS) ×1 IMPLANT
RESERVOIR BACK FLUSH (MISCELLANEOUS) ×2 IMPLANT
ROLLS DENTAL (MISCELLANEOUS) ×4 IMPLANT
SCRAPER DIAMOND DUST MEMBRANE (MISCELLANEOUS) ×1 IMPLANT
SPONGE SURGIFOAM ABS GEL 12-7 (HEMOSTASIS) ×2 IMPLANT
STOPCOCK 4 WAY LG BORE MALE ST (IV SETS) IMPLANT
SUT ETHILON 9 0 TG140 8 (SUTURE) ×1 IMPLANT
SYR 20CC LL (SYRINGE) ×2 IMPLANT
SYR BULB 3OZ (MISCELLANEOUS) ×2 IMPLANT
SYR TB 1ML LUER SLIP (SYRINGE) ×2 IMPLANT
TAPE SURG TRANSPORE 1 IN (GAUZE/BANDAGES/DRESSINGS) IMPLANT
TAPE SURGICAL TRANSPORE 1 IN (GAUZE/BANDAGES/DRESSINGS) ×1
TOWEL OR 17X24 6PK STRL BLUE (TOWEL DISPOSABLE) ×6 IMPLANT
TROCAR CANNULA 25GA (CANNULA) IMPLANT
WATER STERILE IRR 1000ML POUR (IV SOLUTION) ×2 IMPLANT
WIPE INSTRUMENT VISIWIPE 73X73 (MISCELLANEOUS) ×2 IMPLANT

## 2011-12-25 NOTE — Transfer of Care (Signed)
Immediate Anesthesia Transfer of Care Note  Patient: Bryce Buck  Procedure(s) Performed: Procedure(s) (LRB): PARS PLANA VITRECTOMY WITH 25 GAUGE (Left)  Patient Location: PACU  Anesthesia Type: General  Level of Consciousness: awake, alert , oriented and patient cooperative  Airway & Oxygen Therapy: Patient Spontanous Breathing and Patient connected to nasal cannula oxygen  Post-op Assessment: Report given to PACU RN and Post -op Vital signs reviewed and stable  Post vital signs: Reviewed and stable  Complications: No apparent anesthesia complications

## 2011-12-25 NOTE — Preoperative (Signed)
Beta Blockers   Reason not to administer Beta Blockers:metoprolol 12/25/11 1100

## 2011-12-25 NOTE — Progress Notes (Signed)
When reviewing medications during preop visit pt stated "I haven't taken my Aspirin or Metoprolol since Wednesday because Dr. Ashley Royalty told me not to take any medications with aspirin in it and the Metoprolol was one of them." Nurse explained to patient that Metoprolol was a beta blocker and that it does not contain aspirin. Dr. Randa Evens notified of this and requested that Dr. Ashley Royalty be called and confirm this. Dr. Ashley Royalty called and after Nurse gave physician the vital signs Dr. Ashley Royalty stated "I am fine with him having the pill with a small sip of water but please check with Anesthesia first and make sure it is ok and write down the name of the Anesthesiologist because I don't want them saying they can't do the case because the patient had a small sip of water." Dr. Randa Evens called and informed of this and stated "to place the pill on the chart and they will monitor the patient and make sure he only has a small sip of water."

## 2011-12-25 NOTE — Brief Op Note (Signed)
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  Assistant:  Rosalie Doctor SA   Anesthesia: General  Specimen: none  Estimated blood loss:  1cc  Complications: none  Patient sent to PACU in good condition  Composed by Sherrie George MD  Dictation number: [id number]

## 2011-12-25 NOTE — H&P (Signed)
I examined the patient today and there is no change in the medical status 
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
## 2011-12-25 NOTE — Anesthesia Postprocedure Evaluation (Signed)
Anesthesia Post Note  Patient: Bryce Buck  Procedure(s) Performed: Procedure(s) (LRB): PARS PLANA VITRECTOMY WITH 25 GAUGE (Left) GAS/FLUID EXCHANGE (Left) MEMBRANE PEEL (Left)  Anesthesia type: General  Patient location: PACU  Post pain: Pain level controlled and Adequate analgesia  Post assessment: Post-op Vital signs reviewed, Patient's Cardiovascular Status Stable, Respiratory Function Stable, Patent Airway and Pain level controlled  Last Vitals:  Filed Vitals:   12/25/11 1537  BP: 137/72  Pulse:   Temp:   Resp:     Post vital signs: Reviewed and stable  Level of consciousness: awake, alert  and oriented  Complications: No apparent anesthesia complications

## 2011-12-26 MED ORDER — ASPIRIN 81 MG PO TBEC: 81.0000 mg | DELAYED_RELEASE_TABLET | ORAL | Status: AC

## 2011-12-26 MED ORDER — OXYCODONE-ACETAMINOPHEN 5-325 MG PO TABS: 1.0000 | ORAL_TABLET | ORAL | Status: AC | PRN

## 2011-12-26 MED ORDER — METOPROLOL SUCCINATE ER 50 MG PO TB24: 50.0000 mg | ORAL_TABLET | Freq: Two times a day (BID) | ORAL | Status: AC

## 2011-12-26 MED ORDER — PREDNISOLONE ACETATE 1 % OP SUSP: 1.0000 [drp] | Freq: Four times a day (QID) | OPHTHALMIC | Status: AC

## 2011-12-26 MED ORDER — GATIFLOXACIN 0.5 % OP SOLN: 1.0000 [drp] | Freq: Four times a day (QID) | OPHTHALMIC | Status: AC

## 2011-12-26 MED ORDER — AMLODIPINE BESYLATE 5 MG PO TABS: 5.0000 mg | ORAL_TABLET | ORAL | Status: AC

## 2011-12-26 MED ORDER — SIMVASTATIN 40 MG PO TABS: 40.0000 mg | ORAL_TABLET | Freq: Every evening | ORAL | Status: AC

## 2011-12-26 MED ORDER — BACITRACIN-POLYMYXIN B 500-10000 UNIT/GM OP OINT: 1.0000 "application " | TOPICAL_OINTMENT | Freq: Four times a day (QID) | OPHTHALMIC | Status: AC

## 2011-12-26 MED ORDER — ALBUTEROL SULFATE HFA 108 (90 BASE) MCG/ACT IN AERS: 2.0000 | INHALATION_SPRAY | Freq: Four times a day (QID) | RESPIRATORY_TRACT | Status: AC | PRN

## 2011-12-26 NOTE — Discharge Summary (Signed)
Discharge summary not needed on [facility] patients per medical records. 

## 2011-12-26 NOTE — Progress Notes (Signed)
12/26/2011, 6:47 AM  Mental Status:  Awake, Alert, Oriented  Anterior segment: Cornea  Clear    Anterior Chamber Clear    Lens:   IOL  Intra Ocular Pressure 18 mmHg with Tonopen  Vitreous: Clear 25%gas bubble   Retina:  Attached Good laser reaction   Impression: Excellent result Retina attached   Final Diagnosis: Macular pucker    Plan: start post operative eye drops.  Discharge to home.  Give post operative instructions  Sherrie George 12/26/2011, 6:47 AM

## 2011-12-26 NOTE — Progress Notes (Signed)
Patient discharged to home in care of sister. Medications and instructions reviewed with patient and sister with no questions. Assessment unchanged. Patient has follow up with Dr. Ashley Royalty.

## 2011-12-26 NOTE — Op Note (Signed)
NAMEBERLE, FITZ              ACCOUNT NO.:  [id number]  MEDICAL RECORD NO.:  [id number]  LOCATION:  5127                         FACILITY:  [facility]  PHYSICIAN:  Beulah Gandy. Ashley Royalty, M.D. DATE OF BIRTH:  Dec 25, 1940  DATE OF PROCEDURE:  12/25/2011 DATE OF DISCHARGE:                              OPERATIVE REPORT   ADMISSION DIAGNOSES:  Preretinal fibrosis, left eye; vitreous opacities, left eye.  PROCEDURES:  Pars plana vitrectomy with membrane peel, gas-fluid exchange, laser treatment, left eye.  SURGEON:  Beulah Gandy. Ashley Royalty, M.D.  ASSISTANT:  Rosalie Doctor, SA.  ANESTHESIA:  General.  DETAILS:  Usual prep and drape, 25-gauge trocars were placed at 10 and 4 o'clock.  Conjunctival incision with a MVR 20-gauge incision at 2 o'clock.  The contact lens ring was anchored into place at 6 and 12 o'clock and the flat contact lens was placed.  The pars plana vitrectomy was begun just behind the pseudophakos.  The central vitreous core was removed carefully under low suction and rapid cutting down to the macular surface.  The macular surface was thrown into the large and thick white folds.  The vitrectomy was carried into the mid periphery and surface proliferation was removed.  The vitrectomy was carried out to the far periphery.  The wide-field BIOM viewing system was used and vitreous was removed down to the vitreous base off the surface of the scleral buckle.  The magnifying contact lens was placed on the cornea and attention was carried to the area of fibrosis.  The diamond-dusted membrane scraper was then used to engage the membrane and drag it to right and left.  The lighted pick and the 27-gauge pick forceps were used to engage the membrane and peel it from its attachment to the macular region.  The diamond-dusted membrane was used again to clean the surface of the retina around the area that was folded.  The macular pucker was released and the edges became loosened so that the  normal contour of the macula was approached.  The endolaser was positioned in the eye, 598 burns were placed around the retinal periphery on the scleral buckle, power of 1500 mW, 1000 microns each, and 0.1 seconds each.  A 30% gas-fluid exchange was carried out at this point.  The instruments were removed from the eye.  The contact lens ring was removed.  9-0 nylon was used to close the sclerotomy site and the conjunctiva was closed with Wet-Field cautery.  Polymyxin and gentamicin were irrigated into tenon space.  Atropine solution was applied. Marcaine was injected around the globe for postop pain.  Decadron 10 mg was injected into the lower subconjunctival space.  Closing pressure was 10 with a Barraquer tonometer.  Complications none.  Duration 1 hour.  The patient was awakened and taken to recovery in satisfactory condition.     Beulah Gandy. Ashley Royalty, M.D.     JDM/MEDQ  D:  12/25/2011  T:  12/26/2011  Job:  [id number]

## 2011-12-27 ENCOUNTER — Encounter (HOSPITAL_COMMUNITY): Payer: Self-pay | Admitting: Ophthalmology

## 2012-01-01 ENCOUNTER — Inpatient Hospital Stay (INDEPENDENT_AMBULATORY_CARE_PROVIDER_SITE_OTHER): Payer: Medicare Other | Admitting: Ophthalmology

## 2012-01-01 DIAGNOSIS — H35379 Puckering of macula, unspecified eye: Secondary | ICD-10-CM

## 2012-01-08 NOTE — Progress Notes (Signed)
This encounter was created in error - please disregard.

## 2012-01-18 ENCOUNTER — Encounter (INDEPENDENT_AMBULATORY_CARE_PROVIDER_SITE_OTHER): Payer: Medicare Other | Admitting: Ophthalmology

## 2012-01-18 DIAGNOSIS — H33009 Unspecified retinal detachment with retinal break, unspecified eye: Secondary | ICD-10-CM

## 2012-01-18 DIAGNOSIS — H35379 Puckering of macula, unspecified eye: Secondary | ICD-10-CM

## 2012-03-05 ENCOUNTER — Encounter (INDEPENDENT_AMBULATORY_CARE_PROVIDER_SITE_OTHER): Payer: Medicare Other | Admitting: Ophthalmology

## 2012-04-16 ENCOUNTER — Encounter (INDEPENDENT_AMBULATORY_CARE_PROVIDER_SITE_OTHER): Payer: Medicare Other | Admitting: Ophthalmology

## 2012-04-16 DIAGNOSIS — H35359 Cystoid macular degeneration, unspecified eye: Secondary | ICD-10-CM

## 2012-04-16 DIAGNOSIS — H35039 Hypertensive retinopathy, unspecified eye: Secondary | ICD-10-CM

## 2012-04-16 DIAGNOSIS — H35379 Puckering of macula, unspecified eye: Secondary | ICD-10-CM

## 2012-04-16 DIAGNOSIS — H33009 Unspecified retinal detachment with retinal break, unspecified eye: Secondary | ICD-10-CM

## 2012-04-16 DIAGNOSIS — H43819 Vitreous degeneration, unspecified eye: Secondary | ICD-10-CM

## 2012-04-16 DIAGNOSIS — I10 Essential (primary) hypertension: Secondary | ICD-10-CM

## 2012-06-11 ENCOUNTER — Encounter (INDEPENDENT_AMBULATORY_CARE_PROVIDER_SITE_OTHER): Payer: Medicare Other | Admitting: Ophthalmology

## 2012-06-11 DIAGNOSIS — H35379 Puckering of macula, unspecified eye: Secondary | ICD-10-CM

## 2012-06-11 DIAGNOSIS — H35039 Hypertensive retinopathy, unspecified eye: Secondary | ICD-10-CM

## 2012-06-11 DIAGNOSIS — I10 Essential (primary) hypertension: Secondary | ICD-10-CM

## 2012-06-11 DIAGNOSIS — H33009 Unspecified retinal detachment with retinal break, unspecified eye: Secondary | ICD-10-CM

## 2012-06-11 DIAGNOSIS — H35359 Cystoid macular degeneration, unspecified eye: Secondary | ICD-10-CM

## 2012-06-11 DIAGNOSIS — H43819 Vitreous degeneration, unspecified eye: Secondary | ICD-10-CM

## 2012-10-10 ENCOUNTER — Ambulatory Visit (INDEPENDENT_AMBULATORY_CARE_PROVIDER_SITE_OTHER): Payer: Medicare Other | Admitting: Ophthalmology

## 2012-10-10 DIAGNOSIS — H33009 Unspecified retinal detachment with retinal break, unspecified eye: Secondary | ICD-10-CM

## 2012-10-10 DIAGNOSIS — H43819 Vitreous degeneration, unspecified eye: Secondary | ICD-10-CM

## 2012-10-10 DIAGNOSIS — H35359 Cystoid macular degeneration, unspecified eye: Secondary | ICD-10-CM

## 2012-10-10 DIAGNOSIS — H35039 Hypertensive retinopathy, unspecified eye: Secondary | ICD-10-CM

## 2012-10-10 DIAGNOSIS — I10 Essential (primary) hypertension: Secondary | ICD-10-CM

## 2013-02-10 ENCOUNTER — Ambulatory Visit (INDEPENDENT_AMBULATORY_CARE_PROVIDER_SITE_OTHER): Payer: Medicare Other | Admitting: Ophthalmology

## 2013-02-10 DIAGNOSIS — H35039 Hypertensive retinopathy, unspecified eye: Secondary | ICD-10-CM

## 2013-02-10 DIAGNOSIS — H35379 Puckering of macula, unspecified eye: Secondary | ICD-10-CM

## 2013-02-10 DIAGNOSIS — I10 Essential (primary) hypertension: Secondary | ICD-10-CM

## 2013-02-10 DIAGNOSIS — H33009 Unspecified retinal detachment with retinal break, unspecified eye: Secondary | ICD-10-CM

## 2013-02-10 DIAGNOSIS — H35359 Cystoid macular degeneration, unspecified eye: Secondary | ICD-10-CM

## 2013-06-17 ENCOUNTER — Ambulatory Visit (INDEPENDENT_AMBULATORY_CARE_PROVIDER_SITE_OTHER): Payer: Medicare Other | Admitting: Ophthalmology

## 2013-06-17 DIAGNOSIS — H35359 Cystoid macular degeneration, unspecified eye: Secondary | ICD-10-CM

## 2013-06-17 DIAGNOSIS — I10 Essential (primary) hypertension: Secondary | ICD-10-CM

## 2013-06-17 DIAGNOSIS — H43819 Vitreous degeneration, unspecified eye: Secondary | ICD-10-CM

## 2013-06-17 DIAGNOSIS — H35039 Hypertensive retinopathy, unspecified eye: Secondary | ICD-10-CM

## 2013-06-17 DIAGNOSIS — H35379 Puckering of macula, unspecified eye: Secondary | ICD-10-CM

## 2013-10-16 ENCOUNTER — Ambulatory Visit (INDEPENDENT_AMBULATORY_CARE_PROVIDER_SITE_OTHER): Payer: Medicare Other | Admitting: Ophthalmology

## 2013-10-16 DIAGNOSIS — H35379 Puckering of macula, unspecified eye: Secondary | ICD-10-CM

## 2013-10-16 DIAGNOSIS — H43819 Vitreous degeneration, unspecified eye: Secondary | ICD-10-CM

## 2013-10-16 DIAGNOSIS — I10 Essential (primary) hypertension: Secondary | ICD-10-CM

## 2013-10-16 DIAGNOSIS — H35039 Hypertensive retinopathy, unspecified eye: Secondary | ICD-10-CM

## 2013-10-16 DIAGNOSIS — H33009 Unspecified retinal detachment with retinal break, unspecified eye: Secondary | ICD-10-CM

## 2014-10-18 ENCOUNTER — Ambulatory Visit (INDEPENDENT_AMBULATORY_CARE_PROVIDER_SITE_OTHER): Payer: Medicare Other | Admitting: Ophthalmology

## 2014-10-18 DIAGNOSIS — I10 Essential (primary) hypertension: Secondary | ICD-10-CM

## 2014-10-18 DIAGNOSIS — H43813 Vitreous degeneration, bilateral: Secondary | ICD-10-CM | POA: Diagnosis not present

## 2014-10-18 DIAGNOSIS — H59032 Cystoid macular edema following cataract surgery, left eye: Secondary | ICD-10-CM | POA: Diagnosis not present

## 2014-10-18 DIAGNOSIS — H35033 Hypertensive retinopathy, bilateral: Secondary | ICD-10-CM

## 2015-02-17 ENCOUNTER — Ambulatory Visit (INDEPENDENT_AMBULATORY_CARE_PROVIDER_SITE_OTHER): Payer: Medicare Other | Admitting: Ophthalmology

## 2015-02-17 DIAGNOSIS — H338 Other retinal detachments: Secondary | ICD-10-CM | POA: Diagnosis not present

## 2015-02-17 DIAGNOSIS — H35033 Hypertensive retinopathy, bilateral: Secondary | ICD-10-CM

## 2015-02-17 DIAGNOSIS — I10 Essential (primary) hypertension: Secondary | ICD-10-CM | POA: Diagnosis not present

## 2015-02-17 DIAGNOSIS — H43811 Vitreous degeneration, right eye: Secondary | ICD-10-CM | POA: Diagnosis not present

## 2015-02-17 DIAGNOSIS — H59032 Cystoid macular edema following cataract surgery, left eye: Secondary | ICD-10-CM

## 2015-07-21 ENCOUNTER — Emergency Department (HOSPITAL_COMMUNITY)
Admission: EM | Admit: 2015-07-21 | Discharge: 2015-07-21 | Disposition: A | Discharge status: Home / Self Care | Payer: Medicare Other | Attending: Emergency Medicine | Admitting: Emergency Medicine

## 2015-07-21 ENCOUNTER — Encounter (HOSPITAL_COMMUNITY): Payer: Self-pay | Admitting: Emergency Medicine

## 2015-07-21 DIAGNOSIS — I252 Old myocardial infarction: Secondary | ICD-10-CM | POA: Diagnosis not present

## 2015-07-21 DIAGNOSIS — G4730 Sleep apnea, unspecified: Secondary | ICD-10-CM | POA: Diagnosis not present

## 2015-07-21 DIAGNOSIS — I10 Essential (primary) hypertension: Secondary | ICD-10-CM | POA: Insufficient documentation

## 2015-07-21 DIAGNOSIS — Z9889 Other specified postprocedural states: Secondary | ICD-10-CM | POA: Diagnosis not present

## 2015-07-21 DIAGNOSIS — Z87891 Personal history of nicotine dependence: Secondary | ICD-10-CM | POA: Insufficient documentation

## 2015-07-21 DIAGNOSIS — N289 Disorder of kidney and ureter, unspecified: Secondary | ICD-10-CM | POA: Insufficient documentation

## 2015-07-21 DIAGNOSIS — J449 Chronic obstructive pulmonary disease, unspecified: Secondary | ICD-10-CM | POA: Diagnosis not present

## 2015-07-21 DIAGNOSIS — M109 Gout, unspecified: Secondary | ICD-10-CM | POA: Insufficient documentation

## 2015-07-21 DIAGNOSIS — Z792 Long term (current) use of antibiotics: Secondary | ICD-10-CM | POA: Insufficient documentation

## 2015-07-21 DIAGNOSIS — Z9981 Dependence on supplemental oxygen: Secondary | ICD-10-CM | POA: Diagnosis not present

## 2015-07-21 DIAGNOSIS — I2510 Atherosclerotic heart disease of native coronary artery without angina pectoris: Secondary | ICD-10-CM | POA: Insufficient documentation

## 2015-07-21 DIAGNOSIS — Z79899 Other long term (current) drug therapy: Secondary | ICD-10-CM | POA: Insufficient documentation

## 2015-07-21 DIAGNOSIS — Z951 Presence of aortocoronary bypass graft: Secondary | ICD-10-CM | POA: Insufficient documentation

## 2015-07-21 DIAGNOSIS — K219 Gastro-esophageal reflux disease without esophagitis: Secondary | ICD-10-CM | POA: Diagnosis not present

## 2015-07-21 DIAGNOSIS — Z7982 Long term (current) use of aspirin: Secondary | ICD-10-CM | POA: Diagnosis not present

## 2015-07-21 DIAGNOSIS — M25561 Pain in right knee: Secondary | ICD-10-CM | POA: Diagnosis present

## 2015-07-21 LAB — I-STAT CHEM 8, ED
BUN: 31 mg/dL — AB (ref 6–20)
CHLORIDE: 103 mmol/L (ref 101–111)
CREATININE: 1.9 mg/dL — AB (ref 0.61–1.24)
Calcium, Ion: 1.13 mmol/L (ref 1.13–1.30)
Glucose, Bld: 148 mg/dL — ABNORMAL HIGH (ref 65–99)
HEMATOCRIT: 48 % (ref 39.0–52.0)
Hemoglobin: 16.3 g/dL (ref 13.0–17.0)
POTASSIUM: 4.3 mmol/L (ref 3.5–5.1)
Sodium: 135 mmol/L (ref 135–145)
TCO2: 18 mmol/L (ref 0–100)

## 2015-07-21 MED ORDER — OXYCODONE-ACETAMINOPHEN 5-325 MG PO TABS: 1.0000 | ORAL_TABLET | Freq: Four times a day (QID) | ORAL | Status: AC | PRN

## 2015-07-21 MED ORDER — OXYCODONE-ACETAMINOPHEN 5-325 MG PO TABS
1.0000 | ORAL_TABLET | Freq: Once | ORAL | Status: AC
  Administered 2015-07-21: 1 via ORAL
  Filled 2015-07-21: qty 1

## 2015-07-21 NOTE — ED Notes (Signed)
Linker, MD at bedside. 

## 2015-07-21 NOTE — Discharge Instructions (Signed)
Return to the ED with any concerns including fever, increased swelling or pain in joints, vomiting and not able to keep down liquids, decreased level of alertness/lethargy, or any other alarming symptoms  You should be sure to have your kidney function (blood tests) repeated in the next 1-2 weeks.
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
## 2015-07-21 NOTE — ED Notes (Signed)
PT DISCHARGED. INSTRUCTIONS AND PRESCRIPTION GIVEN. AAOX3. PT IN NO APPARENT DISTRESS. THE OPPORTUNITY TO ASK QUESTIONS WAS PROVIDED. [facility] ARRIVED TO TRANSPORT PT HOME.

## 2015-07-21 NOTE — ED Notes (Signed)
Per EMS: Pt c/o joint pain x 3 wks.  Hx of gout.

## 2015-07-23 ENCOUNTER — Emergency Department (HOSPITAL_COMMUNITY)
Admission: EM | Admit: 2015-07-23 | Discharge: 2015-07-23 | Disposition: A | Discharge status: Home / Self Care | Payer: Medicare Other | Attending: Emergency Medicine | Admitting: Emergency Medicine

## 2015-07-23 ENCOUNTER — Encounter (HOSPITAL_COMMUNITY): Payer: Self-pay

## 2015-07-23 ENCOUNTER — Emergency Department (HOSPITAL_COMMUNITY): Payer: Medicare Other

## 2015-07-23 DIAGNOSIS — Z9889 Other specified postprocedural states: Secondary | ICD-10-CM | POA: Insufficient documentation

## 2015-07-23 DIAGNOSIS — Z87891 Personal history of nicotine dependence: Secondary | ICD-10-CM | POA: Diagnosis not present

## 2015-07-23 DIAGNOSIS — Z7982 Long term (current) use of aspirin: Secondary | ICD-10-CM | POA: Diagnosis not present

## 2015-07-23 DIAGNOSIS — Z9981 Dependence on supplemental oxygen: Secondary | ICD-10-CM | POA: Insufficient documentation

## 2015-07-23 DIAGNOSIS — M25562 Pain in left knee: Secondary | ICD-10-CM | POA: Diagnosis present

## 2015-07-23 DIAGNOSIS — I2510 Atherosclerotic heart disease of native coronary artery without angina pectoris: Secondary | ICD-10-CM | POA: Diagnosis not present

## 2015-07-23 DIAGNOSIS — G4730 Sleep apnea, unspecified: Secondary | ICD-10-CM | POA: Insufficient documentation

## 2015-07-23 DIAGNOSIS — Z79899 Other long term (current) drug therapy: Secondary | ICD-10-CM | POA: Insufficient documentation

## 2015-07-23 DIAGNOSIS — M25462 Effusion, left knee: Secondary | ICD-10-CM | POA: Insufficient documentation

## 2015-07-23 DIAGNOSIS — I10 Essential (primary) hypertension: Secondary | ICD-10-CM | POA: Diagnosis not present

## 2015-07-23 DIAGNOSIS — I252 Old myocardial infarction: Secondary | ICD-10-CM | POA: Insufficient documentation

## 2015-07-23 DIAGNOSIS — K219 Gastro-esophageal reflux disease without esophagitis: Secondary | ICD-10-CM | POA: Diagnosis not present

## 2015-07-23 DIAGNOSIS — M1712 Unilateral primary osteoarthritis, left knee: Secondary | ICD-10-CM | POA: Diagnosis not present

## 2015-07-23 DIAGNOSIS — J449 Chronic obstructive pulmonary disease, unspecified: Secondary | ICD-10-CM | POA: Diagnosis not present

## 2015-07-23 DIAGNOSIS — Z951 Presence of aortocoronary bypass graft: Secondary | ICD-10-CM | POA: Diagnosis not present

## 2015-07-23 HISTORY — DX: Gout, unspecified: M10.9

## 2015-07-23 MED ORDER — PREDNISONE 20 MG PO TABS
60.0000 mg | ORAL_TABLET | Freq: Once | ORAL | Status: AC
  Administered 2015-07-23: 60 mg via ORAL
  Filled 2015-07-23: qty 3

## 2015-07-23 MED ORDER — OXYCODONE-ACETAMINOPHEN 5-325 MG PO TABS
1.0000 | ORAL_TABLET | Freq: Once | ORAL | Status: AC
  Administered 2015-07-23: 1 via ORAL
  Filled 2015-07-23: qty 1

## 2015-07-23 NOTE — ED Notes (Signed)
PT RECEIVED VIA EMS C/O CHRONIC LEFT KNEE PAIN. PT WAS SEEN HERE 2 DAYS AGO FOR THE SAME, BUT DID NOT FILL HIS PRESCRIPTIONS. DENIES INJURY.

## 2015-07-23 NOTE — ED Notes (Signed)
Bed: WA20 Expected date:  Expected time:  Means of arrival:  Comments: 75 yo knee pain

## 2015-07-23 NOTE — ED Notes (Signed)
Pt given sandwich and sprite.  
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
## 2015-07-23 NOTE — Discharge Instructions (Signed)
Take Tylenol for pain Use the knee immobilizer, to help you walk, and for comfort   Arthritis Arthritis is a term that is commonly used to refer to joint pain or joint disease. There are more than 100 types of arthritis. CAUSES The most common cause of this condition is wear and tear of a joint. Other causes include:  Gout.  Inflammation of a joint.  An infection of a joint.  Sprains and other injuries near the joint.  A drug reaction or allergic reaction. In some cases, the cause may not be known. SYMPTOMS The main symptom of this condition is pain in the joint with movement. Other symptoms include:  Redness, swelling, or stiffness at a joint.  Warmth coming from the joint.  Fever.  Overall feeling of illness. DIAGNOSIS This condition may be diagnosed with a physical exam and tests, including:  Blood tests.  Urine tests.  Imaging tests, such as MRI, X-rays, or a CT scan. Sometimes, fluid is removed from a joint for testing. TREATMENT Treatment for this condition may involve:  Treatment of the cause, if it is known.  Rest.  Raising (elevating) the joint.  Applying cold or hot packs to the joint.  Medicines to improve symptoms and reduce inflammation.  Injections of a steroid such as cortisone into the joint to help reduce pain and inflammation. Depending on the cause of your arthritis, you may need to make lifestyle changes to reduce stress on your joint. These changes may include exercising more and losing weight. HOME CARE INSTRUCTIONS Medicines  Take over-the-counter and prescription medicines only as told by your health care provider.  Do not take aspirin to relieve pain if gout is suspected. Activities  Rest your joint if told by your health care provider. Rest is important when your disease is active and your joint feels painful, swollen, or stiff.  Avoid activities that make the pain worse. It is important to balance activity with  rest.  Exercise your joint regularly with range-of-motion exercises as told by your health care provider. Try doing low-impact exercise, such as:  Swimming.  Water aerobics.  Biking.  Walking. Joint Care  If your joint is swollen, keep it elevated if told by your health care provider.  If your joint feels stiff in the morning, try taking a warm shower.  If directed, apply heat to the joint. If you have diabetes, do not apply heat without permission from your health care provider.  Put a towel between the joint and the hot pack or heating pad.  Leave the heat on the area for 20-30 minutes.  If directed, apply ice to the joint:  Put ice in a plastic bag.  Place a towel between your skin and the bag.  Leave the ice on for 20 minutes, 2-3 times per day.  Keep all follow-up visits as told by your health care provider. This is important. SEEK MEDICAL CARE IF:  The pain gets worse.  You have a fever. SEEK IMMEDIATE MEDICAL CARE IF:  You develop severe joint pain, swelling, or redness.  Many joints become painful and swollen.  You develop severe back pain.  You develop severe weakness in your leg.  You cannot control your bladder or bowels.   This information is not intended to replace advice given to you by your health care provider. Make sure you discuss any questions you have with your health care provider.   Document Released: 07/26/2004 Document Revised: 03/09/2015 Document Reviewed: 09/13/2014 Elsevier Interactive Patient Education 2016  [employer] ° °

## 2015-08-22 ENCOUNTER — Ambulatory Visit (INDEPENDENT_AMBULATORY_CARE_PROVIDER_SITE_OTHER): Payer: Medicare Other | Admitting: Ophthalmology

## 2017-07-18 IMAGING — CR DG KNEE COMPLETE 4+V*L*
4 series · 4 of 4 positions shown · non-contrast
Comparison: [facility] Axel lower extremity venous Doppler
ultrasound 07/17/2015.

CLINICAL DATA: 74-year-old male with left knee pain for 3 days with
no known injury. Decreased range of motion. Initial encounter.

EXAM:
LEFT KNEE - COMPLETE 4+ VIEW

[x knee ap left (1 of 4)]
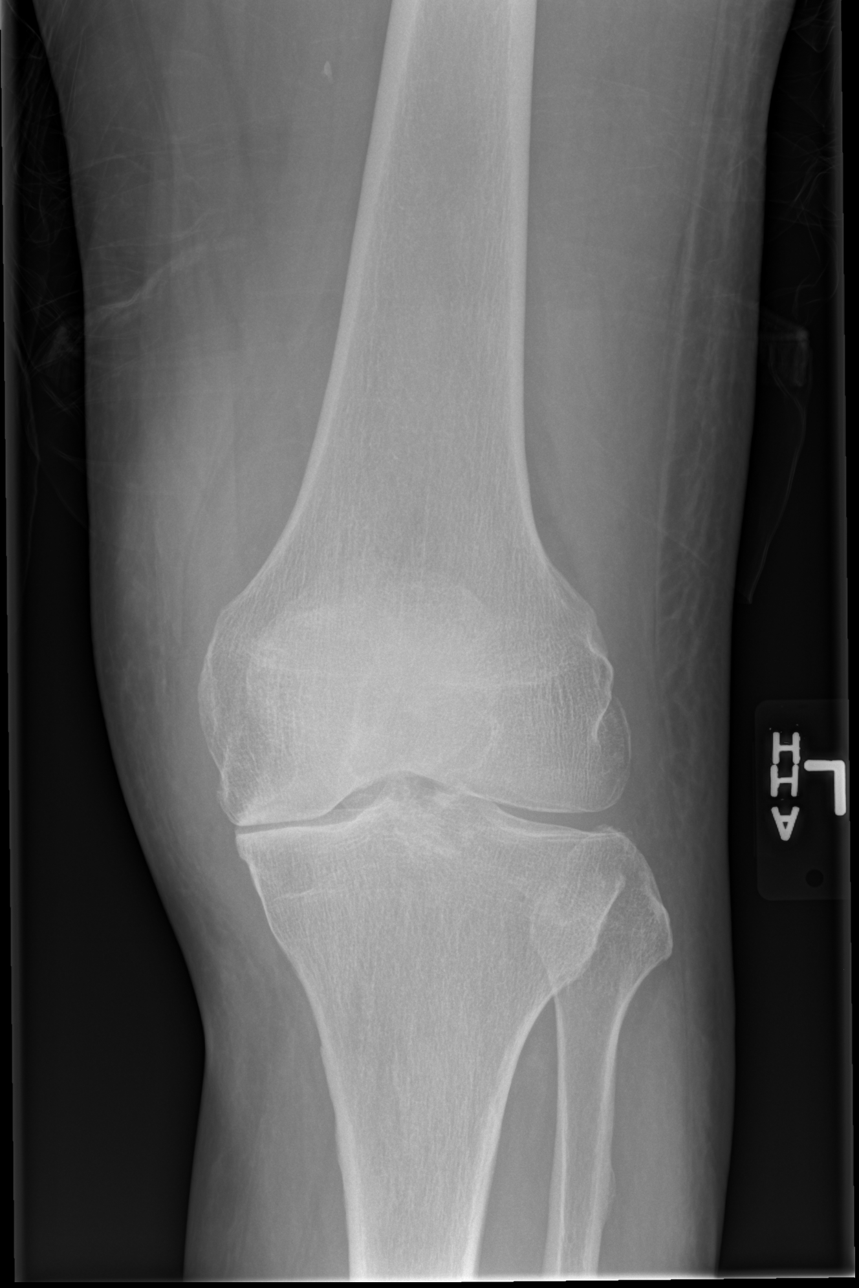

[x knee ap left (2 of 4)]
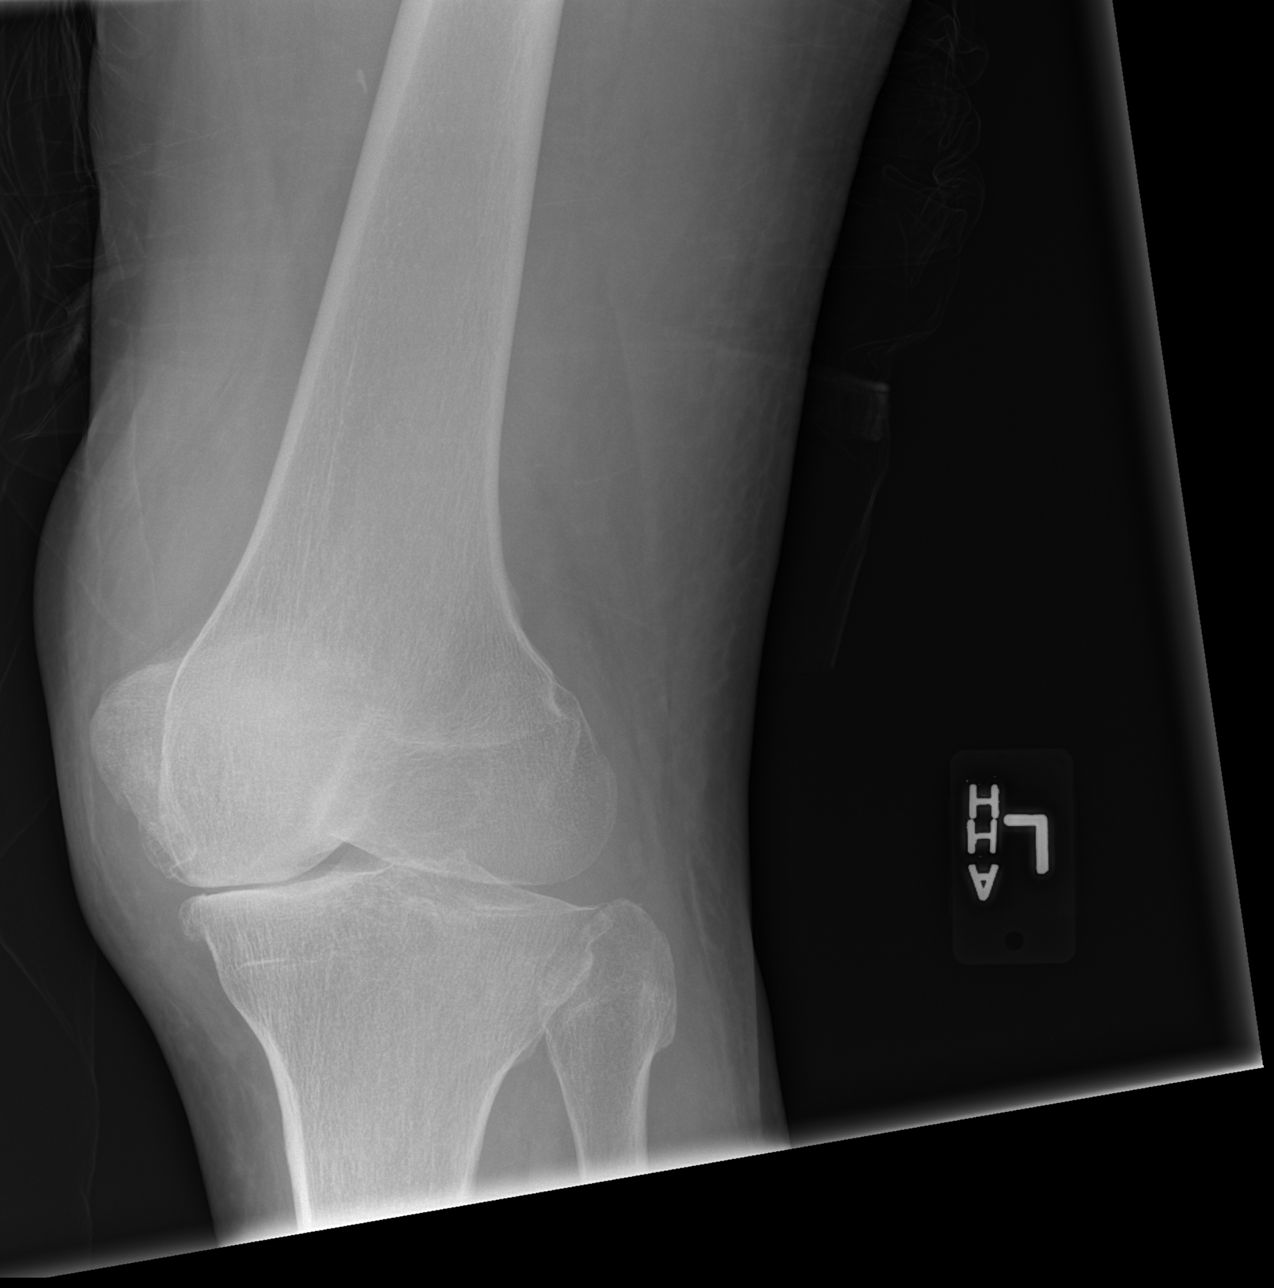

[x knee ap left (3 of 4)]
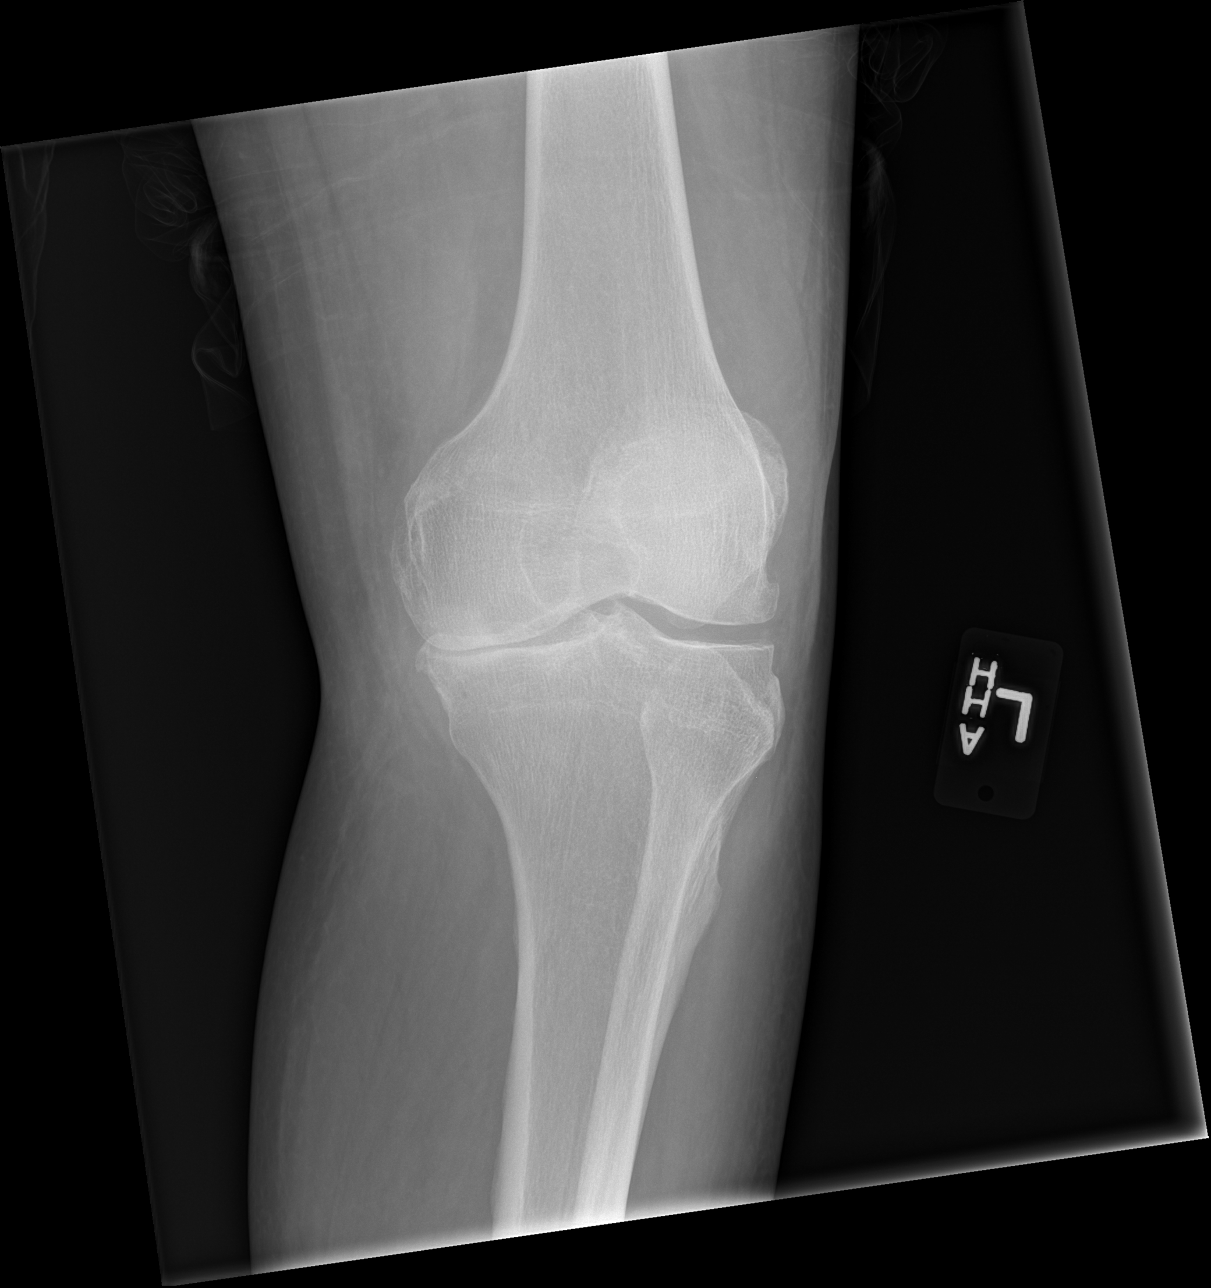

[x knee ap left (4 of 4)]
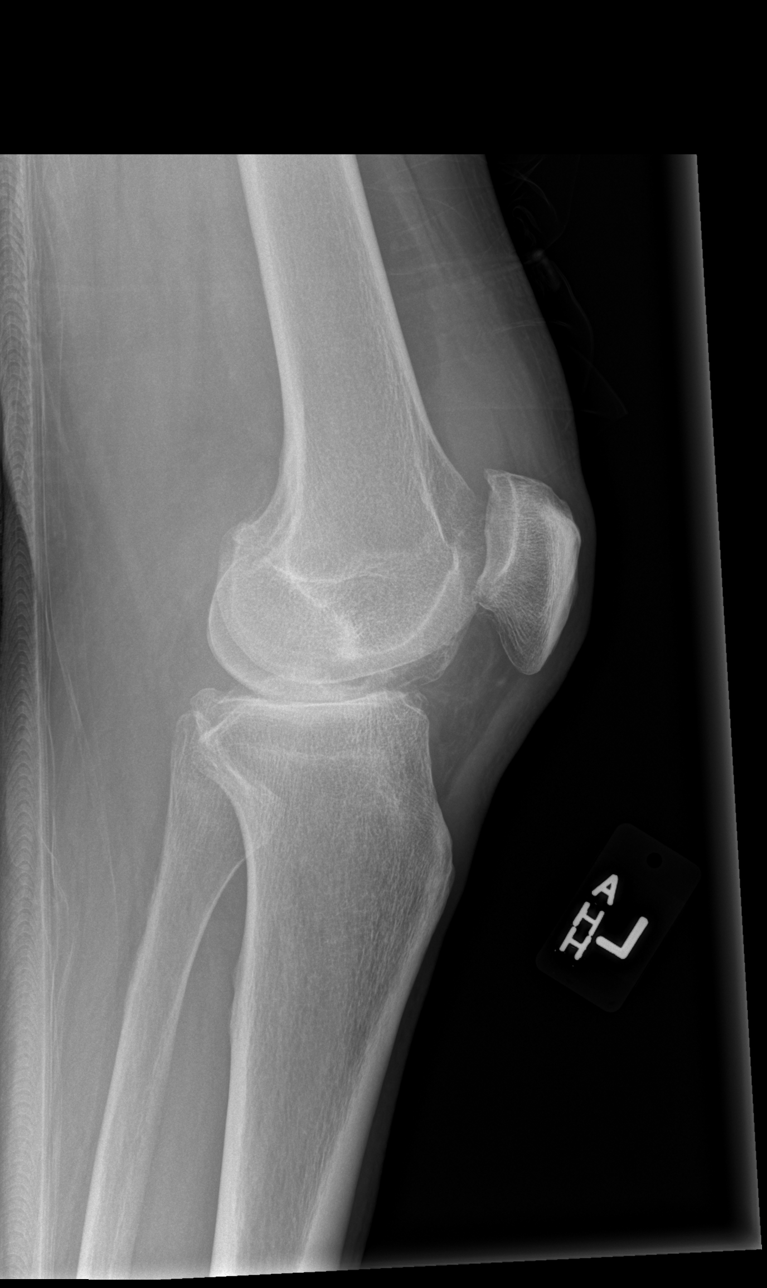

[4 of 4 positions shown; findings below may reference images not displayed]

FINDINGS: Small to moderate suprapatellar joint effusion evident on the
cross-table lateral view. Baker's cyst was identified on the
comparison. Severe medial compartment joint space loss with
subchondral sclerosis and osteophytosis. Mild lateral compartment
and patellofemoral joint space loss. Tricompartmental degenerative
spurring maximal at the medial compartment. No acute osseous
abnormality identified.
IMPRESSION: Knee joint effusion with degenerative changes, severe in the medial
compartment.

## 2017-09-26 DIAGNOSIS — R55 Syncope and collapse: Secondary | ICD-10-CM

## 2017-09-30 DIAGNOSIS — I480 Paroxysmal atrial fibrillation: Secondary | ICD-10-CM | POA: Diagnosis not present

## 2017-09-30 DIAGNOSIS — R0602 Shortness of breath: Secondary | ICD-10-CM | POA: Diagnosis not present

## 2017-09-30 DIAGNOSIS — I5042 Chronic combined systolic (congestive) and diastolic (congestive) heart failure: Secondary | ICD-10-CM | POA: Diagnosis not present

## 2017-09-30 DIAGNOSIS — J1100 Influenza due to unidentified influenza virus with unspecified type of pneumonia: Secondary | ICD-10-CM

## 2017-09-30 DIAGNOSIS — K433 Parastomal hernia with obstruction, without gangrene: Secondary | ICD-10-CM | POA: Diagnosis not present

## 2017-09-30 DIAGNOSIS — I2510 Atherosclerotic heart disease of native coronary artery without angina pectoris: Secondary | ICD-10-CM | POA: Diagnosis not present

## 2017-09-30 DIAGNOSIS — J9601 Acute respiratory failure with hypoxia: Secondary | ICD-10-CM

## 2017-09-30 DIAGNOSIS — J441 Chronic obstructive pulmonary disease with (acute) exacerbation: Secondary | ICD-10-CM | POA: Diagnosis not present

## 2017-09-30 DIAGNOSIS — I130 Hypertensive heart and chronic kidney disease with heart failure and stage 1 through stage 4 chronic kidney disease, or unspecified chronic kidney disease: Secondary | ICD-10-CM

## 2017-10-01 DIAGNOSIS — I2510 Atherosclerotic heart disease of native coronary artery without angina pectoris: Secondary | ICD-10-CM | POA: Diagnosis not present

## 2017-10-01 DIAGNOSIS — J441 Chronic obstructive pulmonary disease with (acute) exacerbation: Secondary | ICD-10-CM | POA: Diagnosis not present

## 2017-10-01 DIAGNOSIS — I130 Hypertensive heart and chronic kidney disease with heart failure and stage 1 through stage 4 chronic kidney disease, or unspecified chronic kidney disease: Secondary | ICD-10-CM | POA: Diagnosis not present

## 2017-10-01 DIAGNOSIS — J9601 Acute respiratory failure with hypoxia: Secondary | ICD-10-CM | POA: Diagnosis not present

## 2017-10-01 DIAGNOSIS — I480 Paroxysmal atrial fibrillation: Secondary | ICD-10-CM | POA: Diagnosis not present

## 2017-10-01 DIAGNOSIS — R0602 Shortness of breath: Secondary | ICD-10-CM | POA: Diagnosis not present

## 2017-10-01 DIAGNOSIS — J1100 Influenza due to unidentified influenza virus with unspecified type of pneumonia: Secondary | ICD-10-CM | POA: Diagnosis not present

## 2017-10-01 DIAGNOSIS — I5042 Chronic combined systolic (congestive) and diastolic (congestive) heart failure: Secondary | ICD-10-CM | POA: Diagnosis not present

## 2017-10-01 DIAGNOSIS — K433 Parastomal hernia with obstruction, without gangrene: Secondary | ICD-10-CM | POA: Diagnosis not present

## 2017-10-02 DIAGNOSIS — J1100 Influenza due to unidentified influenza virus with unspecified type of pneumonia: Secondary | ICD-10-CM | POA: Diagnosis not present

## 2017-10-02 DIAGNOSIS — J9601 Acute respiratory failure with hypoxia: Secondary | ICD-10-CM | POA: Diagnosis not present

## 2017-10-02 DIAGNOSIS — I130 Hypertensive heart and chronic kidney disease with heart failure and stage 1 through stage 4 chronic kidney disease, or unspecified chronic kidney disease: Secondary | ICD-10-CM | POA: Diagnosis not present

## 2017-10-02 DIAGNOSIS — R0602 Shortness of breath: Secondary | ICD-10-CM | POA: Diagnosis not present

## 2017-10-02 DIAGNOSIS — I2510 Atherosclerotic heart disease of native coronary artery without angina pectoris: Secondary | ICD-10-CM | POA: Diagnosis not present

## 2017-10-02 DIAGNOSIS — I480 Paroxysmal atrial fibrillation: Secondary | ICD-10-CM | POA: Diagnosis not present

## 2017-10-03 DIAGNOSIS — R0602 Shortness of breath: Secondary | ICD-10-CM | POA: Diagnosis not present

## 2017-10-03 DIAGNOSIS — I130 Hypertensive heart and chronic kidney disease with heart failure and stage 1 through stage 4 chronic kidney disease, or unspecified chronic kidney disease: Secondary | ICD-10-CM | POA: Diagnosis not present

## 2017-10-03 DIAGNOSIS — J9601 Acute respiratory failure with hypoxia: Secondary | ICD-10-CM | POA: Diagnosis not present

## 2017-10-03 DIAGNOSIS — J1100 Influenza due to unidentified influenza virus with unspecified type of pneumonia: Secondary | ICD-10-CM | POA: Diagnosis not present

## 2017-10-04 ENCOUNTER — Ambulatory Visit: Payer: Medicare HMO | Admitting: Cardiology

## 2017-10-04 DIAGNOSIS — R0602 Shortness of breath: Secondary | ICD-10-CM | POA: Diagnosis not present

## 2017-10-04 DIAGNOSIS — J9601 Acute respiratory failure with hypoxia: Secondary | ICD-10-CM | POA: Diagnosis not present

## 2017-10-04 DIAGNOSIS — I130 Hypertensive heart and chronic kidney disease with heart failure and stage 1 through stage 4 chronic kidney disease, or unspecified chronic kidney disease: Secondary | ICD-10-CM | POA: Diagnosis not present

## 2017-10-04 DIAGNOSIS — J1100 Influenza due to unidentified influenza virus with unspecified type of pneumonia: Secondary | ICD-10-CM | POA: Diagnosis not present

## 2017-10-05 DIAGNOSIS — R0602 Shortness of breath: Secondary | ICD-10-CM | POA: Diagnosis not present

## 2017-10-05 DIAGNOSIS — J9601 Acute respiratory failure with hypoxia: Secondary | ICD-10-CM | POA: Diagnosis not present

## 2017-10-05 DIAGNOSIS — J1100 Influenza due to unidentified influenza virus with unspecified type of pneumonia: Secondary | ICD-10-CM | POA: Diagnosis not present

## 2017-10-05 DIAGNOSIS — I130 Hypertensive heart and chronic kidney disease with heart failure and stage 1 through stage 4 chronic kidney disease, or unspecified chronic kidney disease: Secondary | ICD-10-CM | POA: Diagnosis not present

## 2017-10-06 DIAGNOSIS — J9601 Acute respiratory failure with hypoxia: Secondary | ICD-10-CM | POA: Diagnosis not present

## 2017-10-06 DIAGNOSIS — J1100 Influenza due to unidentified influenza virus with unspecified type of pneumonia: Secondary | ICD-10-CM | POA: Diagnosis not present

## 2017-10-06 DIAGNOSIS — I130 Hypertensive heart and chronic kidney disease with heart failure and stage 1 through stage 4 chronic kidney disease, or unspecified chronic kidney disease: Secondary | ICD-10-CM | POA: Diagnosis not present

## 2017-10-06 DIAGNOSIS — R0602 Shortness of breath: Secondary | ICD-10-CM | POA: Diagnosis not present

## 2017-10-07 DIAGNOSIS — R0602 Shortness of breath: Secondary | ICD-10-CM | POA: Diagnosis not present

## 2017-10-07 DIAGNOSIS — J1100 Influenza due to unidentified influenza virus with unspecified type of pneumonia: Secondary | ICD-10-CM | POA: Diagnosis not present

## 2017-10-07 DIAGNOSIS — R531 Weakness: Secondary | ICD-10-CM | POA: Diagnosis not present

## 2017-10-07 DIAGNOSIS — I130 Hypertensive heart and chronic kidney disease with heart failure and stage 1 through stage 4 chronic kidney disease, or unspecified chronic kidney disease: Secondary | ICD-10-CM | POA: Diagnosis not present

## 2017-10-07 DIAGNOSIS — J9601 Acute respiratory failure with hypoxia: Secondary | ICD-10-CM | POA: Diagnosis not present

## 2017-10-08 DIAGNOSIS — R0602 Shortness of breath: Secondary | ICD-10-CM | POA: Diagnosis not present

## 2017-10-08 DIAGNOSIS — I130 Hypertensive heart and chronic kidney disease with heart failure and stage 1 through stage 4 chronic kidney disease, or unspecified chronic kidney disease: Secondary | ICD-10-CM | POA: Diagnosis not present

## 2017-10-08 DIAGNOSIS — J1100 Influenza due to unidentified influenza virus with unspecified type of pneumonia: Secondary | ICD-10-CM | POA: Diagnosis not present

## 2017-10-08 DIAGNOSIS — J9601 Acute respiratory failure with hypoxia: Secondary | ICD-10-CM | POA: Diagnosis not present

## 2017-10-09 DIAGNOSIS — I5042 Chronic combined systolic (congestive) and diastolic (congestive) heart failure: Secondary | ICD-10-CM | POA: Diagnosis not present

## 2017-10-09 DIAGNOSIS — J1100 Influenza due to unidentified influenza virus with unspecified type of pneumonia: Secondary | ICD-10-CM | POA: Diagnosis not present

## 2017-10-09 DIAGNOSIS — K433 Parastomal hernia with obstruction, without gangrene: Secondary | ICD-10-CM | POA: Diagnosis not present

## 2017-10-09 DIAGNOSIS — J441 Chronic obstructive pulmonary disease with (acute) exacerbation: Secondary | ICD-10-CM | POA: Diagnosis not present

## 2017-10-09 DIAGNOSIS — I130 Hypertensive heart and chronic kidney disease with heart failure and stage 1 through stage 4 chronic kidney disease, or unspecified chronic kidney disease: Secondary | ICD-10-CM | POA: Diagnosis not present

## 2017-10-09 DIAGNOSIS — J9601 Acute respiratory failure with hypoxia: Secondary | ICD-10-CM | POA: Diagnosis not present

## 2017-10-09 DIAGNOSIS — R0602 Shortness of breath: Secondary | ICD-10-CM | POA: Diagnosis not present

## 2017-10-09 DIAGNOSIS — I480 Paroxysmal atrial fibrillation: Secondary | ICD-10-CM | POA: Diagnosis not present

## 2017-10-09 DIAGNOSIS — I2510 Atherosclerotic heart disease of native coronary artery without angina pectoris: Secondary | ICD-10-CM | POA: Diagnosis not present

## 2017-10-10 DIAGNOSIS — R0602 Shortness of breath: Secondary | ICD-10-CM | POA: Diagnosis not present

## 2017-10-10 DIAGNOSIS — J1100 Influenza due to unidentified influenza virus with unspecified type of pneumonia: Secondary | ICD-10-CM | POA: Diagnosis not present

## 2017-10-10 DIAGNOSIS — J9601 Acute respiratory failure with hypoxia: Secondary | ICD-10-CM | POA: Diagnosis not present

## 2017-10-10 DIAGNOSIS — I130 Hypertensive heart and chronic kidney disease with heart failure and stage 1 through stage 4 chronic kidney disease, or unspecified chronic kidney disease: Secondary | ICD-10-CM | POA: Diagnosis not present

## 2017-10-11 DIAGNOSIS — I130 Hypertensive heart and chronic kidney disease with heart failure and stage 1 through stage 4 chronic kidney disease, or unspecified chronic kidney disease: Secondary | ICD-10-CM | POA: Diagnosis not present

## 2017-10-11 DIAGNOSIS — J1100 Influenza due to unidentified influenza virus with unspecified type of pneumonia: Secondary | ICD-10-CM | POA: Diagnosis not present

## 2017-10-11 DIAGNOSIS — J9601 Acute respiratory failure with hypoxia: Secondary | ICD-10-CM | POA: Diagnosis not present

## 2017-10-11 DIAGNOSIS — R0602 Shortness of breath: Secondary | ICD-10-CM | POA: Diagnosis not present

## 2017-10-12 DIAGNOSIS — J9601 Acute respiratory failure with hypoxia: Secondary | ICD-10-CM | POA: Diagnosis not present

## 2017-10-12 DIAGNOSIS — R0602 Shortness of breath: Secondary | ICD-10-CM | POA: Diagnosis not present

## 2017-10-12 DIAGNOSIS — J1100 Influenza due to unidentified influenza virus with unspecified type of pneumonia: Secondary | ICD-10-CM | POA: Diagnosis not present

## 2017-10-12 DIAGNOSIS — I130 Hypertensive heart and chronic kidney disease with heart failure and stage 1 through stage 4 chronic kidney disease, or unspecified chronic kidney disease: Secondary | ICD-10-CM | POA: Diagnosis not present

## 2017-10-13 DIAGNOSIS — J9601 Acute respiratory failure with hypoxia: Secondary | ICD-10-CM | POA: Diagnosis not present

## 2017-10-13 DIAGNOSIS — R0602 Shortness of breath: Secondary | ICD-10-CM | POA: Diagnosis not present

## 2017-10-13 DIAGNOSIS — J1100 Influenza due to unidentified influenza virus with unspecified type of pneumonia: Secondary | ICD-10-CM | POA: Diagnosis not present

## 2017-10-13 DIAGNOSIS — I130 Hypertensive heart and chronic kidney disease with heart failure and stage 1 through stage 4 chronic kidney disease, or unspecified chronic kidney disease: Secondary | ICD-10-CM | POA: Diagnosis not present

## 2017-10-14 DIAGNOSIS — J1100 Influenza due to unidentified influenza virus with unspecified type of pneumonia: Secondary | ICD-10-CM | POA: Diagnosis not present

## 2017-10-14 DIAGNOSIS — J9601 Acute respiratory failure with hypoxia: Secondary | ICD-10-CM | POA: Diagnosis not present

## 2017-10-14 DIAGNOSIS — I130 Hypertensive heart and chronic kidney disease with heart failure and stage 1 through stage 4 chronic kidney disease, or unspecified chronic kidney disease: Secondary | ICD-10-CM | POA: Diagnosis not present

## 2017-10-14 DIAGNOSIS — R0602 Shortness of breath: Secondary | ICD-10-CM | POA: Diagnosis not present

## 2017-10-15 DIAGNOSIS — J9601 Acute respiratory failure with hypoxia: Secondary | ICD-10-CM | POA: Diagnosis not present

## 2017-10-15 DIAGNOSIS — R0602 Shortness of breath: Secondary | ICD-10-CM | POA: Diagnosis not present

## 2017-10-15 DIAGNOSIS — J1100 Influenza due to unidentified influenza virus with unspecified type of pneumonia: Secondary | ICD-10-CM | POA: Diagnosis not present

## 2017-10-15 DIAGNOSIS — I130 Hypertensive heart and chronic kidney disease with heart failure and stage 1 through stage 4 chronic kidney disease, or unspecified chronic kidney disease: Secondary | ICD-10-CM | POA: Diagnosis not present

## 2017-10-16 DIAGNOSIS — K433 Parastomal hernia with obstruction, without gangrene: Secondary | ICD-10-CM | POA: Diagnosis not present

## 2017-10-16 DIAGNOSIS — J1100 Influenza due to unidentified influenza virus with unspecified type of pneumonia: Secondary | ICD-10-CM | POA: Diagnosis not present

## 2017-10-16 DIAGNOSIS — I2510 Atherosclerotic heart disease of native coronary artery without angina pectoris: Secondary | ICD-10-CM | POA: Diagnosis not present

## 2017-10-16 DIAGNOSIS — I480 Paroxysmal atrial fibrillation: Secondary | ICD-10-CM | POA: Diagnosis not present

## 2017-10-16 DIAGNOSIS — I5042 Chronic combined systolic (congestive) and diastolic (congestive) heart failure: Secondary | ICD-10-CM | POA: Diagnosis not present

## 2017-10-16 DIAGNOSIS — J441 Chronic obstructive pulmonary disease with (acute) exacerbation: Secondary | ICD-10-CM | POA: Diagnosis not present

## 2017-10-16 DIAGNOSIS — J9601 Acute respiratory failure with hypoxia: Secondary | ICD-10-CM | POA: Diagnosis not present

## 2017-10-16 DIAGNOSIS — R0602 Shortness of breath: Secondary | ICD-10-CM | POA: Diagnosis not present

## 2017-10-16 DIAGNOSIS — I130 Hypertensive heart and chronic kidney disease with heart failure and stage 1 through stage 4 chronic kidney disease, or unspecified chronic kidney disease: Secondary | ICD-10-CM | POA: Diagnosis not present

## 2017-10-17 DIAGNOSIS — R0602 Shortness of breath: Secondary | ICD-10-CM | POA: Diagnosis not present

## 2017-10-17 DIAGNOSIS — J9601 Acute respiratory failure with hypoxia: Secondary | ICD-10-CM | POA: Diagnosis not present

## 2017-10-17 DIAGNOSIS — J1100 Influenza due to unidentified influenza virus with unspecified type of pneumonia: Secondary | ICD-10-CM | POA: Diagnosis not present

## 2017-10-17 DIAGNOSIS — I130 Hypertensive heart and chronic kidney disease with heart failure and stage 1 through stage 4 chronic kidney disease, or unspecified chronic kidney disease: Secondary | ICD-10-CM | POA: Diagnosis not present

## 2017-10-18 DIAGNOSIS — R0602 Shortness of breath: Secondary | ICD-10-CM | POA: Diagnosis not present

## 2017-10-18 DIAGNOSIS — I130 Hypertensive heart and chronic kidney disease with heart failure and stage 1 through stage 4 chronic kidney disease, or unspecified chronic kidney disease: Secondary | ICD-10-CM | POA: Diagnosis not present

## 2017-10-18 DIAGNOSIS — I2510 Atherosclerotic heart disease of native coronary artery without angina pectoris: Secondary | ICD-10-CM | POA: Diagnosis not present

## 2017-10-18 DIAGNOSIS — I5042 Chronic combined systolic (congestive) and diastolic (congestive) heart failure: Secondary | ICD-10-CM | POA: Diagnosis not present

## 2017-10-18 DIAGNOSIS — J9601 Acute respiratory failure with hypoxia: Secondary | ICD-10-CM | POA: Diagnosis not present

## 2017-10-18 DIAGNOSIS — I480 Paroxysmal atrial fibrillation: Secondary | ICD-10-CM | POA: Diagnosis not present

## 2017-10-18 DIAGNOSIS — K433 Parastomal hernia with obstruction, without gangrene: Secondary | ICD-10-CM | POA: Diagnosis not present

## 2017-10-18 DIAGNOSIS — J441 Chronic obstructive pulmonary disease with (acute) exacerbation: Secondary | ICD-10-CM | POA: Diagnosis not present

## 2017-10-18 DIAGNOSIS — J1100 Influenza due to unidentified influenza virus with unspecified type of pneumonia: Secondary | ICD-10-CM | POA: Diagnosis not present

## 2017-10-19 DIAGNOSIS — R0602 Shortness of breath: Secondary | ICD-10-CM | POA: Diagnosis not present

## 2017-10-19 DIAGNOSIS — J1100 Influenza due to unidentified influenza virus with unspecified type of pneumonia: Secondary | ICD-10-CM | POA: Diagnosis not present

## 2017-10-19 DIAGNOSIS — I130 Hypertensive heart and chronic kidney disease with heart failure and stage 1 through stage 4 chronic kidney disease, or unspecified chronic kidney disease: Secondary | ICD-10-CM | POA: Diagnosis not present

## 2017-10-19 DIAGNOSIS — J9601 Acute respiratory failure with hypoxia: Secondary | ICD-10-CM | POA: Diagnosis not present

## 2017-10-20 DIAGNOSIS — J9601 Acute respiratory failure with hypoxia: Secondary | ICD-10-CM | POA: Diagnosis not present

## 2017-10-20 DIAGNOSIS — I130 Hypertensive heart and chronic kidney disease with heart failure and stage 1 through stage 4 chronic kidney disease, or unspecified chronic kidney disease: Secondary | ICD-10-CM | POA: Diagnosis not present

## 2017-10-20 DIAGNOSIS — R0602 Shortness of breath: Secondary | ICD-10-CM | POA: Diagnosis not present

## 2017-10-20 DIAGNOSIS — J1100 Influenza due to unidentified influenza virus with unspecified type of pneumonia: Secondary | ICD-10-CM | POA: Diagnosis not present

## 2017-10-21 DIAGNOSIS — I130 Hypertensive heart and chronic kidney disease with heart failure and stage 1 through stage 4 chronic kidney disease, or unspecified chronic kidney disease: Secondary | ICD-10-CM | POA: Diagnosis not present

## 2017-10-21 DIAGNOSIS — J9601 Acute respiratory failure with hypoxia: Secondary | ICD-10-CM | POA: Diagnosis not present

## 2017-10-21 DIAGNOSIS — R0602 Shortness of breath: Secondary | ICD-10-CM | POA: Diagnosis not present

## 2017-10-21 DIAGNOSIS — J1100 Influenza due to unidentified influenza virus with unspecified type of pneumonia: Secondary | ICD-10-CM | POA: Diagnosis not present

## 2017-11-29 ENCOUNTER — Telehealth: Payer: Self-pay

## 2017-11-29 NOTE — Telephone Encounter (Signed)
Sent referral portion to scheduling, attached notes to May file

## 2018-01-01 ENCOUNTER — Ambulatory Visit: Payer: Medicare HMO | Admitting: Cardiovascular Disease

## 2018-04-30 ENCOUNTER — Encounter: Payer: Self-pay | Admitting: *Deleted

## 2021-04-01 DEATH — deceased
